# Patient Record
Sex: Female | Born: 1935
Health system: Southern US, Community
[De-identification: ages and names within clinical notes are randomized; demographics above are authoritative.]

## PROBLEM LIST (undated history)

## (undated) DIAGNOSIS — N183 Chronic kidney disease, stage 3 unspecified: Secondary | ICD-10-CM

## (undated) DIAGNOSIS — E78019 Familial hypercholesterolemia, unspecified: Secondary | ICD-10-CM

## (undated) DIAGNOSIS — H409 Unspecified glaucoma: Secondary | ICD-10-CM

## (undated) DIAGNOSIS — E7801 Familial hypercholesterolemia: Secondary | ICD-10-CM

## (undated) DIAGNOSIS — G43909 Migraine, unspecified, not intractable, without status migrainosus: Secondary | ICD-10-CM

## (undated) DIAGNOSIS — E039 Hypothyroidism, unspecified: Secondary | ICD-10-CM

## (undated) DIAGNOSIS — K219 Gastro-esophageal reflux disease without esophagitis: Secondary | ICD-10-CM

## (undated) DIAGNOSIS — B0229 Other postherpetic nervous system involvement: Secondary | ICD-10-CM

## (undated) HISTORY — DX: Familial hypercholesterolemia: E78.01

## (undated) HISTORY — PX: OTHER SURGICAL HISTORY: SHX169

## (undated) HISTORY — PX: BASAL CELL CARCINOMA EXCISION: SHX1214

## (undated) HISTORY — DX: Unspecified glaucoma: H40.9

## (undated) HISTORY — PX: TUBAL LIGATION: SHX77

## (undated) HISTORY — PX: APPENDECTOMY: SHX54

## (undated) HISTORY — PX: INCONTINENCE SURGERY: SHX676

## (undated) HISTORY — PX: ANAL FISSURE REPAIR: SHX2312

## (undated) HISTORY — DX: Hypothyroidism, unspecified: E03.9

## (undated) HISTORY — PX: CHOLECYSTECTOMY: SHX55

## (undated) HISTORY — PX: THYROIDECTOMY: SHX17

## (undated) HISTORY — DX: Other postherpetic nervous system involvement: B02.29

## (undated) HISTORY — DX: Chronic kidney disease, stage 3 unspecified: N18.30

## (undated) HISTORY — PX: VAGINAL HYSTERECTOMY: SUR661

## (undated) HISTORY — DX: Familial hypercholesterolemia, unspecified: E78.019

## (undated) HISTORY — DX: Chronic kidney disease, stage 3 (moderate): N18.3

## (undated) HISTORY — DX: Migraine, unspecified, not intractable, without status migrainosus: G43.909

## (undated) HISTORY — DX: Gastro-esophageal reflux disease without esophagitis: K21.9

---

## 1998-06-16 ENCOUNTER — Other Ambulatory Visit: Admission: RE | Admit: 1998-06-16 | Discharge: 1998-06-16 | Payer: Self-pay | Admitting: Obstetrics and Gynecology

## 1999-06-22 ENCOUNTER — Other Ambulatory Visit: Admission: RE | Admit: 1999-06-22 | Discharge: 1999-06-22 | Payer: Self-pay | Admitting: Obstetrics and Gynecology

## 2000-06-29 ENCOUNTER — Other Ambulatory Visit: Admission: RE | Admit: 2000-06-29 | Discharge: 2000-06-29 | Payer: Self-pay | Admitting: Obstetrics and Gynecology

## 2001-06-19 ENCOUNTER — Other Ambulatory Visit: Admission: RE | Admit: 2001-06-19 | Discharge: 2001-06-19 | Payer: Self-pay | Admitting: Obstetrics and Gynecology

## 2002-06-20 ENCOUNTER — Other Ambulatory Visit: Admission: RE | Admit: 2002-06-20 | Discharge: 2002-06-20 | Payer: Self-pay | Admitting: Obstetrics and Gynecology

## 2002-08-21 ENCOUNTER — Encounter: Payer: Self-pay | Admitting: Urology

## 2002-08-23 ENCOUNTER — Observation Stay (HOSPITAL_COMMUNITY): Admission: RE | Admit: 2002-08-23 | Discharge: 2002-08-24 | Payer: Self-pay | Admitting: Urology

## 2003-02-06 ENCOUNTER — Encounter: Admission: RE | Admit: 2003-02-06 | Discharge: 2003-03-29 | Payer: Self-pay | Admitting: Family Medicine

## 2003-04-03 ENCOUNTER — Ambulatory Visit (HOSPITAL_COMMUNITY): Admission: RE | Admit: 2003-04-03 | Discharge: 2003-04-03 | Payer: Self-pay | Admitting: Gastroenterology

## 2003-12-09 ENCOUNTER — Emergency Department (HOSPITAL_COMMUNITY): Admission: EM | Admit: 2003-12-09 | Discharge: 2003-12-09 | Payer: Self-pay | Admitting: Emergency Medicine

## 2003-12-31 ENCOUNTER — Ambulatory Visit (HOSPITAL_COMMUNITY): Admission: RE | Admit: 2003-12-31 | Discharge: 2004-01-01 | Payer: Self-pay | Admitting: Surgery

## 2003-12-31 ENCOUNTER — Encounter (INDEPENDENT_AMBULATORY_CARE_PROVIDER_SITE_OTHER): Payer: Self-pay | Admitting: *Deleted

## 2004-08-26 ENCOUNTER — Other Ambulatory Visit: Admission: RE | Admit: 2004-08-26 | Discharge: 2004-08-26 | Payer: Self-pay | Admitting: Obstetrics and Gynecology

## 2004-10-06 ENCOUNTER — Encounter: Admission: RE | Admit: 2004-10-06 | Discharge: 2004-10-06 | Payer: Self-pay | Admitting: Allergy and Immunology

## 2005-04-07 ENCOUNTER — Ambulatory Visit: Payer: Self-pay | Admitting: Critical Care Medicine

## 2005-04-30 ENCOUNTER — Ambulatory Visit: Payer: Self-pay | Admitting: Pulmonary Disease

## 2005-05-24 ENCOUNTER — Ambulatory Visit: Payer: Self-pay | Admitting: Critical Care Medicine

## 2005-07-12 ENCOUNTER — Ambulatory Visit: Payer: Self-pay | Admitting: Critical Care Medicine

## 2006-08-22 ENCOUNTER — Encounter: Admission: RE | Admit: 2006-08-22 | Discharge: 2006-09-26 | Payer: Self-pay | Admitting: Family Medicine

## 2008-09-04 ENCOUNTER — Encounter: Admission: RE | Admit: 2008-09-04 | Discharge: 2008-09-04 | Payer: Self-pay | Admitting: Gastroenterology

## 2008-10-03 ENCOUNTER — Encounter: Admission: RE | Admit: 2008-10-03 | Discharge: 2008-11-13 | Payer: Self-pay | Admitting: Family Medicine

## 2011-01-01 NOTE — Op Note (Signed)
Brittney Brittney [name], Brittney [name]                       ACCOUNT NO.:  [id number]   MEDICAL RECORD NO.:  [id number]                   PATIENT TYPE:  OIB   LOCATION:  6120                                 FACILITY:  [facility]   PHYSICIAN:  Sandria Bales. Ezzard Standing, M.D.               DATE OF BIRTH:  02-28-1935   DATE OF PROCEDURE:  12/31/2003  DATE OF DISCHARGE:                                 OPERATIVE REPORT   PREOPERATIVE DIAGNOSIS:  Chronic cholecystitis with cholelithiasis.   POSTOPERATIVE DIAGNOSIS:  Chronic cholecystitis with cholelithiasis.   OPERATION PERFORMED:  Laparoscopic cholecystectomy with intraoperative  cholangiogram.   SURGEON:  Sandria Bales. Ezzard Standing, M.D.   ASSISTANT:  Anselm Pancoast. Zachery Dakins, M.D.   ANESTHESIA:  General endotracheal.   ESTIMATED BLOOD LOSS:  Minimal.   INDICATIONS FOR PROCEDURE:  Brittney Brittney [name] is a 76 year old white female who  presented to [facility] Emergency Department on December 09, 2003 with  abdominal pain and sludge in her gallbladder, mild elevated liver function  tests.  She was felt to have gallbladder disease at that time.  I explained  to her the indications and potential complications of gallbladder surgery,  potential complications including but not limited to bleeding, infection,  bile duct injury and open surgery.  She has since scheduled herself for  attempted laparoscopic cholecystectomy.   DESCRIPTION OF PROCEDURE:  The patient presents to the operating room.  She  has a possible latex allergy and we did precautions with this.  She was  given 1 g Ancef at the initiation of the procedure, had PAS stockings in  place.  Her abdomen was prepped with Betadine solution and sterilely draped.  Infraumbilical incision was made with sharp dissection carried down into the  abdominal cavity.  A 30 degree 10 mm laparoscope was inserted through a 12  mm Hasson.  The Hasson was secured with a 0 Vicryl suture.  A laparoscopic  evaluation was carried out.  The right and  left lobes of the liver were  unremarkable.  The stomach was unremarkable.  The bowel was covered with  omentum which limited exposure to the bowel.  There was nothing obvious in  the pelvis.   Three additional trocars were placed, a 10 mm subxiphoid Ethicon trocar, a 5  mm right subcostal and a 5 mm left subcostal trocar.  The gallbladder was  grasped and rotated cephalad.  There were adhesions of the distal one third  of the gallbladder with the duodenum attached to this.  She did have some  evidence of chronic gallbladder disease.   Dissection was carried out mobilizing the gallbladder.  I identified the  gallbladder cystic duct junction and identified the cystic artery, triply  endoclipped the cystic artery, placed a clip on the gallbladder side of the  cystic duct and shot an intraoperative cholangiogram.  The intraoperative  cholangiogram was shot with half strength Hypaque solution through a cut off  taut catheter.  The taut catheter was inserted through a 14 gauge Jelco into  the side of the cut cystic duct.  When I cut the cystic duct, I did milk out  some debris out of the cystic duct and this would be consistent with cystic  duct obstruction.  I was unable to pass the taut catheter without  difficulty, shot an intraoperative cholangiogram with about 6 mL of half  strength Hypaque solution. This showed free flow of contrast down the cystic  duct into the common bile duct up the hepatic radicals and down into the  duodenum making the normal S-curve of the common bile duct and hepatic duct.  This was felt to be a normal intraoperative cholangiogram.  The taut  catheter was then removed.  The cystic duct was triply endoclipped and then  divided. The gallbladder was then sharply and bluntly dissected up off the  gallbladder bed using primarily hook Bovie coagulation.  Prior to complete  division of the gallbladder from the gallbladder bed, I revisualized the  triangle of Calot.  I  revisualized the gallbladder bed, there was no  bleeding and no bile leak.  The gallbladder was then divided, delivered  through the umbilicus intact and sent to pathology.   I then irrigated the abdomen with about 500 mL of saline.  Each port was  then visualized as the trocars were removed.  The umbilical port closed with  a 0 Vicryl suture.  I infiltrated each port site with 0.25% Marcaine.  The  skin at each site was closed with a 5-0 Vicryl suture, painted with tincture  of benzoin and steri-stripped.  The patient tolerated the procedure well and  was transported to the recovery room in good condition.  Sponge and needle  counts were correct at the end of this case.                                               Sandria Bales. Ezzard Standing, M.D.    DHN/MEDQ  D:  12/31/2003  T:  12/31/2003  Job:  [id number]   cc:   [name]. [name], M.D.  [address]  [place]  [place] [address]  Fax: [phone]   [name] [name], M.D.  [address]  [place]  [place] [address]  Fax: [phone]   Clabe Seal. [name], M.D.  [address]  [place]  [place] [address]  Fax: [phone]

## 2011-01-01 NOTE — Consult Note (Signed)
Brittney [name], TATE NO.:  [id number]   MEDICAL RECORD NO.:  [id number]                   PATIENT TYPE:  EMS   LOCATION:  ED                                   FACILITY:  [facility]   PHYSICIAN:  Sandria Bales. Ezzard Standing, M.D.               DATE OF BIRTH:  1934/11/13   DATE OF CONSULTATION:  12/09/2003  DATE OF DISCHARGE:                                   CONSULTATION   REFERRING PHYSICIAN:  [name]. [name], M.D.   HISTORY OF PRESENT ILLNESS:  This is a 76 year old, white female, patient of  Dr. [name], who had some vague GI complaints, but starting having  symptoms last Tuesday which was April 20, with retrosternal chest pain she  described as aching radiating down her left arm.  This apparently got  better.  She did not bring it to medical attention.  On Friday, April 23,  she had another attack of pain, kind of epigastric radiating to her chest  and down both sides, but did not go down her arm this time.  Then, late last  night and early this morning, she awoke again with kind of vague  retrosternal epigastric abdominal pain which may be a little more right-  sided this time.  She has had no history of peptic ulcer disease, but she  does have significant reflux for which she is on Nexium.  In fact, she even  doubled her dose of Nexium which seemed to help a little bit with her  symptoms.   She has denied any known history of liver disease, pancreatic disease, colon  disease.  She did have a colonoscopy by Dr. [name] in 2003, which was  negative as far as she knows.   PAST SURGICAL HISTORY:  1. Appendectomy as a child in 43.  2. Tubal ligation in 1962.  3. Laparoscopic surgery in 1986, for evaluation of cystitis.  4. Vaginal repair and sling for urinary incontinence by [name]     [name], M.D. last year in 2003.   ALLERGIES:  ALLEGRA which makes her short of breath, AUGMENTIN causes  itching, ELAVIL which makes her see things crooked,  MOTRIN which makes her  tongue swell, SULFA which she cannot remember and LIPITOR (not really sure  why she had trouble, but she describes an allergy or intolerance).   CURRENT MEDICATIONS:  1. Synthroid 0.075 mg daily.  2. Zocor 20 mg daily.  3. Nexium 40 mg daily.  4. Flexeril 5 mg daily.  5. Ecotrin 325 mg daily.  6. Vitamin C 2000 mg daily.  7. Fioricet and Imitrex for migraine headaches on about a once a week basis.  8. Vivelle patch for hormones.   REVIEW OF SYMPTOMS:  NEUROLOGIC:  She has had migraines for years.  They  have actually gotten somewhat better and she is again followed by Dr.  [name] for this.  PULMONARY:  No  history pneumonia or tuberculosis.  CARDIAC:  No history of chest disease, angina or cardiac evaluation.  GASTROINTESTINAL:  See history of present illness.  UROLOGIC:  She says  since her incontinence surgery and her vaginal repair, she has had some kind  of dry odor for which she has taken Ecotrin and Vitamin C and this has  helped a little bit.  She also underwent a CT scan of her abdomen in  November 2004, and as far as she know is okay.  This was by Dr. [name].  GYNECOLOGIC:  She has four children, three girls and a boy and seven  grandchildren.  She has not worked in recent years.   PHYSICAL EXAMINATION:  VITAL SIGNS:  Temperature 97.8, pulse 76,  respirations 20.  HEENT:  Normal pupils.  Mucosa of her nose and mouth are okay.  NECK:  Shows a scar from a thyroid surgery, but no thyroid mass.  LUNGS:  Clear to auscultation.  HEART:  Regular rate and rhythm without murmur or rub.  BREASTS:  I did not exam, but she has had mammograms which have been  reported to her as normal.  ABDOMEN:  Soft.  She has a right lower quadrant scar from her prior  appendectomy.  She has some very vague soreness, but it is actually most in  the left lower quadrant.  She has not guarding, no rebound, no palpable  mass, no evidence of hernia.  EXTREMITIES:  She has  good strength in upper and lower extremities.  NEUROLOGIC:  Grossly intact.   LABORATORY DATA AND X-RAY FINDINGS:  White blood count 7800, hemoglobin  14.3, hematocrit 42.  Sodium 137, potassium 3.5, chloride 105, CO2 29,  glucose 143, BUN 12, creatinine 1.0.  Her SGOT 287, SGPT 145, Alk phos 120,  total bilirubin 1.5.  Urinalysis was negative.  CK-MB 1.3 ng/ml and troponin  0.01 ng/ml.  Her EKG showed a normal sinus rhythm.   She had an ultrasound which I reviewed with Dr. [name].  It does show  sludge in the gallbladder.  There is no thickening of the gallbladder wall,  no common bile duct dilatation and no other mass.   IMPRESSION:  1. Ms. Leyendecker has probable gallbladder disease with vague epigastric     symptoms, mild elevation of liver functions in face of gallbladder     sludge.  I spoke to her about the options for further evaluation versus     surgery.  I am really not sure if any further evaluation will change the     underlying factor of her gallbladder sludge and liver function changes.     I talked about laparoscopic cholecystectomy with the indications and     potential complications including, but not limited to bleeding,     infection, open surgery and common bile duct injury.  I gave her some     Vicodin for pain.  They are going to call our office today to schedule     this surgery electively in the next couple of weeks.  I told them we have     literature in the office to go by and obtain to review this more.  2. Mild liver function and elevation, presumably secondary to gallbladder,     possibly secondary to Zocor.  She had liver function tests done at Dr.     [name] office which we will try to obtain.  3. Computed tomography scan done by Dr. [name].  We will just review  this and make sure there is nothing else identified on that.  4. Reflux esophagitis.  5. Right thigh bursitis which bothers her some. 6. Dry odor on Ecotrin and Vitamin C.  She knows  to stop the Ecotrin at     least seven days ahead of the operation.  7. Migraine headaches followed by Dr. [name].  8. Hypercholesterolemia.  9. Glaucoma.                                               Sandria Bales. Ezzard Standing, M.D.    DHN/MEDQ  D:  12/09/2003  T:  12/09/2003  Job:  [id number]   cc:   [name]. [name], M.D.  [address]  [place]  [place] [address]  Fax: [phone]   [name] [name], M.D.  [address]  [place]  [place] [address]  Fax: [phone]   Clabe Seal. [name], M.D.  [address]  [place]  [place] [address]  Fax: [phone]

## 2011-06-02 ENCOUNTER — Other Ambulatory Visit: Payer: Self-pay | Admitting: Gastroenterology

## 2011-06-02 DIAGNOSIS — R197 Diarrhea, unspecified: Secondary | ICD-10-CM

## 2011-06-15 ENCOUNTER — Ambulatory Visit
Admission: RE | Admit: 2011-06-15 | Discharge: 2011-06-15 | Disposition: A | Discharge status: Home / Self Care | Payer: Medicare Other | Source: Ambulatory Visit | Attending: Gastroenterology | Admitting: Gastroenterology

## 2011-06-15 DIAGNOSIS — R197 Diarrhea, unspecified: Secondary | ICD-10-CM

## 2011-06-15 MED ORDER — IOHEXOL 300 MG/ML  SOLN
100.0000 mL | Freq: Once | INTRAMUSCULAR | Status: AC | PRN
  Administered 2011-06-15: 100 mL via INTRAVENOUS

## 2011-11-22 DIAGNOSIS — Z13820 Encounter for screening for osteoporosis: Secondary | ICD-10-CM | POA: Diagnosis not present

## 2011-11-22 DIAGNOSIS — Z124 Encounter for screening for malignant neoplasm of cervix: Secondary | ICD-10-CM | POA: Diagnosis not present

## 2011-11-22 DIAGNOSIS — M899 Disorder of bone, unspecified: Secondary | ICD-10-CM | POA: Diagnosis not present

## 2011-12-07 DIAGNOSIS — N949 Unspecified condition associated with female genital organs and menstrual cycle: Secondary | ICD-10-CM | POA: Diagnosis not present

## 2011-12-30 DIAGNOSIS — H409 Unspecified glaucoma: Secondary | ICD-10-CM | POA: Diagnosis not present

## 2011-12-30 DIAGNOSIS — H4011X0 Primary open-angle glaucoma, stage unspecified: Secondary | ICD-10-CM | POA: Diagnosis not present

## 2012-02-10 DIAGNOSIS — Z0000 Encounter for general adult medical examination without abnormal findings: Secondary | ICD-10-CM | POA: Diagnosis not present

## 2012-02-10 DIAGNOSIS — R9389 Abnormal findings on diagnostic imaging of other specified body structures: Secondary | ICD-10-CM | POA: Diagnosis not present

## 2012-02-10 DIAGNOSIS — Z23 Encounter for immunization: Secondary | ICD-10-CM | POA: Diagnosis not present

## 2012-02-10 DIAGNOSIS — E559 Vitamin D deficiency, unspecified: Secondary | ICD-10-CM | POA: Diagnosis not present

## 2012-02-10 DIAGNOSIS — Z79899 Other long term (current) drug therapy: Secondary | ICD-10-CM | POA: Diagnosis not present

## 2012-02-10 DIAGNOSIS — R7301 Impaired fasting glucose: Secondary | ICD-10-CM | POA: Diagnosis not present

## 2012-02-10 DIAGNOSIS — E039 Hypothyroidism, unspecified: Secondary | ICD-10-CM | POA: Diagnosis not present

## 2012-02-10 DIAGNOSIS — E7800 Pure hypercholesterolemia, unspecified: Secondary | ICD-10-CM | POA: Diagnosis not present

## 2012-02-24 DIAGNOSIS — I422 Other hypertrophic cardiomyopathy: Secondary | ICD-10-CM | POA: Diagnosis not present

## 2012-04-04 DIAGNOSIS — H25019 Cortical age-related cataract, unspecified eye: Secondary | ICD-10-CM | POA: Insufficient documentation

## 2012-04-04 DIAGNOSIS — H409 Unspecified glaucoma: Secondary | ICD-10-CM | POA: Diagnosis not present

## 2012-04-04 DIAGNOSIS — H2510 Age-related nuclear cataract, unspecified eye: Secondary | ICD-10-CM | POA: Diagnosis not present

## 2012-04-04 DIAGNOSIS — H4010X0 Unspecified open-angle glaucoma, stage unspecified: Secondary | ICD-10-CM | POA: Insufficient documentation

## 2012-04-27 DIAGNOSIS — Z0000 Encounter for general adult medical examination without abnormal findings: Secondary | ICD-10-CM | POA: Diagnosis not present

## 2012-05-05 ENCOUNTER — Ambulatory Visit
Admission: RE | Admit: 2012-05-05 | Discharge: 2012-05-05 | Disposition: A | Discharge status: Home / Self Care | Payer: Medicare Other | Source: Ambulatory Visit | Attending: Family Medicine | Admitting: Family Medicine

## 2012-05-05 ENCOUNTER — Other Ambulatory Visit: Payer: Self-pay | Admitting: Family Medicine

## 2012-05-05 DIAGNOSIS — M7989 Other specified soft tissue disorders: Secondary | ICD-10-CM | POA: Diagnosis not present

## 2012-05-05 DIAGNOSIS — M79609 Pain in unspecified limb: Secondary | ICD-10-CM | POA: Diagnosis not present

## 2012-05-05 DIAGNOSIS — M79669 Pain in unspecified lower leg: Secondary | ICD-10-CM

## 2012-05-17 DIAGNOSIS — Z91040 Latex allergy status: Secondary | ICD-10-CM | POA: Insufficient documentation

## 2012-05-17 DIAGNOSIS — E041 Nontoxic single thyroid nodule: Secondary | ICD-10-CM | POA: Insufficient documentation

## 2012-05-17 DIAGNOSIS — E785 Hyperlipidemia, unspecified: Secondary | ICD-10-CM | POA: Insufficient documentation

## 2012-05-17 DIAGNOSIS — Z8619 Personal history of other infectious and parasitic diseases: Secondary | ICD-10-CM | POA: Insufficient documentation

## 2012-05-29 DIAGNOSIS — K219 Gastro-esophageal reflux disease without esophagitis: Secondary | ICD-10-CM | POA: Diagnosis not present

## 2012-05-29 DIAGNOSIS — H2510 Age-related nuclear cataract, unspecified eye: Secondary | ICD-10-CM | POA: Diagnosis not present

## 2012-05-29 DIAGNOSIS — E785 Hyperlipidemia, unspecified: Secondary | ICD-10-CM | POA: Diagnosis not present

## 2012-05-29 DIAGNOSIS — Z91040 Latex allergy status: Secondary | ICD-10-CM | POA: Diagnosis not present

## 2012-05-29 DIAGNOSIS — I10 Essential (primary) hypertension: Secondary | ICD-10-CM | POA: Diagnosis not present

## 2012-05-29 DIAGNOSIS — K589 Irritable bowel syndrome without diarrhea: Secondary | ICD-10-CM | POA: Diagnosis not present

## 2012-05-29 DIAGNOSIS — I2510 Atherosclerotic heart disease of native coronary artery without angina pectoris: Secondary | ICD-10-CM | POA: Diagnosis not present

## 2012-05-29 DIAGNOSIS — Z961 Presence of intraocular lens: Secondary | ICD-10-CM | POA: Insufficient documentation

## 2012-05-29 DIAGNOSIS — IMO0001 Reserved for inherently not codable concepts without codable children: Secondary | ICD-10-CM | POA: Diagnosis not present

## 2012-05-29 DIAGNOSIS — Z79899 Other long term (current) drug therapy: Secondary | ICD-10-CM | POA: Diagnosis not present

## 2012-06-06 DIAGNOSIS — Z23 Encounter for immunization: Secondary | ICD-10-CM | POA: Diagnosis not present

## 2012-06-06 DIAGNOSIS — Z9849 Cataract extraction status, unspecified eye: Secondary | ICD-10-CM | POA: Diagnosis not present

## 2012-06-06 DIAGNOSIS — Z961 Presence of intraocular lens: Secondary | ICD-10-CM | POA: Diagnosis not present

## 2012-06-27 DIAGNOSIS — Z9849 Cataract extraction status, unspecified eye: Secondary | ICD-10-CM | POA: Diagnosis not present

## 2012-06-27 DIAGNOSIS — Z961 Presence of intraocular lens: Secondary | ICD-10-CM | POA: Diagnosis not present

## 2012-07-27 DIAGNOSIS — E039 Hypothyroidism, unspecified: Secondary | ICD-10-CM | POA: Diagnosis not present

## 2012-07-27 DIAGNOSIS — R42 Dizziness and giddiness: Secondary | ICD-10-CM | POA: Diagnosis not present

## 2012-07-27 DIAGNOSIS — H4011X0 Primary open-angle glaucoma, stage unspecified: Secondary | ICD-10-CM | POA: Diagnosis not present

## 2012-07-27 DIAGNOSIS — H6590 Unspecified nonsuppurative otitis media, unspecified ear: Secondary | ICD-10-CM | POA: Diagnosis not present

## 2012-07-27 DIAGNOSIS — H409 Unspecified glaucoma: Secondary | ICD-10-CM | POA: Diagnosis not present

## 2012-07-28 ENCOUNTER — Other Ambulatory Visit: Payer: Self-pay | Admitting: Family Medicine

## 2012-07-28 DIAGNOSIS — R42 Dizziness and giddiness: Secondary | ICD-10-CM

## 2012-08-01 ENCOUNTER — Ambulatory Visit
Admission: RE | Admit: 2012-08-01 | Discharge: 2012-08-01 | Disposition: A | Discharge status: Home / Self Care | Payer: Medicare Other | Source: Ambulatory Visit | Attending: Family Medicine | Admitting: Family Medicine

## 2012-08-01 DIAGNOSIS — I658 Occlusion and stenosis of other precerebral arteries: Secondary | ICD-10-CM | POA: Diagnosis not present

## 2012-08-01 DIAGNOSIS — R42 Dizziness and giddiness: Secondary | ICD-10-CM | POA: Diagnosis not present

## 2012-08-01 DIAGNOSIS — R51 Headache: Secondary | ICD-10-CM | POA: Diagnosis not present

## 2012-08-14 ENCOUNTER — Other Ambulatory Visit: Payer: Self-pay | Admitting: Dermatology

## 2012-08-14 DIAGNOSIS — L821 Other seborrheic keratosis: Secondary | ICD-10-CM | POA: Diagnosis not present

## 2012-08-14 DIAGNOSIS — D485 Neoplasm of uncertain behavior of skin: Secondary | ICD-10-CM | POA: Diagnosis not present

## 2012-08-14 DIAGNOSIS — L570 Actinic keratosis: Secondary | ICD-10-CM | POA: Diagnosis not present

## 2012-08-14 DIAGNOSIS — Z85828 Personal history of other malignant neoplasm of skin: Secondary | ICD-10-CM | POA: Diagnosis not present

## 2012-08-14 DIAGNOSIS — L820 Inflamed seborrheic keratosis: Secondary | ICD-10-CM | POA: Diagnosis not present

## 2012-08-15 DIAGNOSIS — Z1231 Encounter for screening mammogram for malignant neoplasm of breast: Secondary | ICD-10-CM | POA: Diagnosis not present

## 2012-09-21 DIAGNOSIS — R350 Frequency of micturition: Secondary | ICD-10-CM | POA: Diagnosis not present

## 2012-09-21 DIAGNOSIS — N289 Disorder of kidney and ureter, unspecified: Secondary | ICD-10-CM | POA: Diagnosis not present

## 2012-09-21 DIAGNOSIS — E7800 Pure hypercholesterolemia, unspecified: Secondary | ICD-10-CM | POA: Diagnosis not present

## 2012-10-02 DIAGNOSIS — R109 Unspecified abdominal pain: Secondary | ICD-10-CM | POA: Diagnosis not present

## 2012-10-02 DIAGNOSIS — N3942 Incontinence without sensory awareness: Secondary | ICD-10-CM | POA: Diagnosis not present

## 2012-10-02 DIAGNOSIS — R350 Frequency of micturition: Secondary | ICD-10-CM | POA: Diagnosis not present

## 2012-10-04 DIAGNOSIS — K589 Irritable bowel syndrome without diarrhea: Secondary | ICD-10-CM | POA: Diagnosis not present

## 2012-10-04 DIAGNOSIS — R350 Frequency of micturition: Secondary | ICD-10-CM | POA: Diagnosis not present

## 2012-10-04 DIAGNOSIS — K219 Gastro-esophageal reflux disease without esophagitis: Secondary | ICD-10-CM | POA: Diagnosis not present

## 2012-10-04 DIAGNOSIS — R1084 Generalized abdominal pain: Secondary | ICD-10-CM | POA: Diagnosis not present

## 2012-10-10 DIAGNOSIS — Z85828 Personal history of other malignant neoplasm of skin: Secondary | ICD-10-CM | POA: Diagnosis not present

## 2012-10-10 DIAGNOSIS — R109 Unspecified abdominal pain: Secondary | ICD-10-CM | POA: Diagnosis not present

## 2012-10-10 DIAGNOSIS — R1032 Left lower quadrant pain: Secondary | ICD-10-CM | POA: Diagnosis not present

## 2012-10-26 DIAGNOSIS — H409 Unspecified glaucoma: Secondary | ICD-10-CM | POA: Diagnosis not present

## 2012-10-26 DIAGNOSIS — H4011X0 Primary open-angle glaucoma, stage unspecified: Secondary | ICD-10-CM | POA: Diagnosis not present

## 2012-10-27 DIAGNOSIS — N3942 Incontinence without sensory awareness: Secondary | ICD-10-CM | POA: Diagnosis not present

## 2012-10-27 DIAGNOSIS — R350 Frequency of micturition: Secondary | ICD-10-CM | POA: Diagnosis not present

## 2012-11-03 DIAGNOSIS — N3942 Incontinence without sensory awareness: Secondary | ICD-10-CM | POA: Diagnosis not present

## 2012-11-03 DIAGNOSIS — R350 Frequency of micturition: Secondary | ICD-10-CM | POA: Diagnosis not present

## 2012-12-05 DIAGNOSIS — R1084 Generalized abdominal pain: Secondary | ICD-10-CM | POA: Diagnosis not present

## 2012-12-05 DIAGNOSIS — Z1211 Encounter for screening for malignant neoplasm of colon: Secondary | ICD-10-CM | POA: Diagnosis not present

## 2012-12-05 DIAGNOSIS — K5900 Constipation, unspecified: Secondary | ICD-10-CM | POA: Diagnosis not present

## 2012-12-07 DIAGNOSIS — Z124 Encounter for screening for malignant neoplasm of cervix: Secondary | ICD-10-CM | POA: Diagnosis not present

## 2012-12-11 DIAGNOSIS — R350 Frequency of micturition: Secondary | ICD-10-CM | POA: Diagnosis not present

## 2012-12-11 DIAGNOSIS — N3942 Incontinence without sensory awareness: Secondary | ICD-10-CM | POA: Diagnosis not present

## 2013-02-20 DIAGNOSIS — R7301 Impaired fasting glucose: Secondary | ICD-10-CM | POA: Diagnosis not present

## 2013-02-20 DIAGNOSIS — E7800 Pure hypercholesterolemia, unspecified: Secondary | ICD-10-CM | POA: Diagnosis not present

## 2013-02-20 DIAGNOSIS — Z1331 Encounter for screening for depression: Secondary | ICD-10-CM | POA: Diagnosis not present

## 2013-02-20 DIAGNOSIS — Z0000 Encounter for general adult medical examination without abnormal findings: Secondary | ICD-10-CM | POA: Diagnosis not present

## 2013-02-20 DIAGNOSIS — Z23 Encounter for immunization: Secondary | ICD-10-CM | POA: Diagnosis not present

## 2013-02-20 DIAGNOSIS — E039 Hypothyroidism, unspecified: Secondary | ICD-10-CM | POA: Diagnosis not present

## 2013-02-20 DIAGNOSIS — R5381 Other malaise: Secondary | ICD-10-CM | POA: Diagnosis not present

## 2013-02-20 DIAGNOSIS — R5383 Other fatigue: Secondary | ICD-10-CM | POA: Diagnosis not present

## 2013-02-20 DIAGNOSIS — R011 Cardiac murmur, unspecified: Secondary | ICD-10-CM | POA: Diagnosis not present

## 2013-04-12 ENCOUNTER — Other Ambulatory Visit: Payer: Self-pay | Admitting: Gastroenterology

## 2013-04-12 DIAGNOSIS — K648 Other hemorrhoids: Secondary | ICD-10-CM | POA: Diagnosis not present

## 2013-04-12 DIAGNOSIS — Z1211 Encounter for screening for malignant neoplasm of colon: Secondary | ICD-10-CM | POA: Diagnosis not present

## 2013-04-12 DIAGNOSIS — K5730 Diverticulosis of large intestine without perforation or abscess without bleeding: Secondary | ICD-10-CM | POA: Diagnosis not present

## 2013-04-12 DIAGNOSIS — D126 Benign neoplasm of colon, unspecified: Secondary | ICD-10-CM | POA: Diagnosis not present

## 2013-04-18 DIAGNOSIS — R351 Nocturia: Secondary | ICD-10-CM | POA: Diagnosis not present

## 2013-04-18 DIAGNOSIS — R350 Frequency of micturition: Secondary | ICD-10-CM | POA: Diagnosis not present

## 2013-04-18 DIAGNOSIS — N3942 Incontinence without sensory awareness: Secondary | ICD-10-CM | POA: Diagnosis not present

## 2013-04-20 DIAGNOSIS — H4010X0 Unspecified open-angle glaucoma, stage unspecified: Secondary | ICD-10-CM | POA: Diagnosis not present

## 2013-04-20 DIAGNOSIS — H409 Unspecified glaucoma: Secondary | ICD-10-CM | POA: Diagnosis not present

## 2013-05-30 DIAGNOSIS — Z23 Encounter for immunization: Secondary | ICD-10-CM | POA: Diagnosis not present

## 2013-06-13 DIAGNOSIS — R1084 Generalized abdominal pain: Secondary | ICD-10-CM | POA: Diagnosis not present

## 2013-08-21 DIAGNOSIS — E7800 Pure hypercholesterolemia, unspecified: Secondary | ICD-10-CM | POA: Diagnosis not present

## 2013-08-21 DIAGNOSIS — K219 Gastro-esophageal reflux disease without esophagitis: Secondary | ICD-10-CM | POA: Diagnosis not present

## 2013-08-21 DIAGNOSIS — J329 Chronic sinusitis, unspecified: Secondary | ICD-10-CM | POA: Diagnosis not present

## 2013-08-21 DIAGNOSIS — N183 Chronic kidney disease, stage 3 unspecified: Secondary | ICD-10-CM | POA: Diagnosis not present

## 2013-08-21 DIAGNOSIS — R7301 Impaired fasting glucose: Secondary | ICD-10-CM | POA: Diagnosis not present

## 2013-08-21 DIAGNOSIS — E039 Hypothyroidism, unspecified: Secondary | ICD-10-CM | POA: Diagnosis not present

## 2013-08-22 DIAGNOSIS — Z1231 Encounter for screening mammogram for malignant neoplasm of breast: Secondary | ICD-10-CM | POA: Diagnosis not present

## 2013-09-20 DIAGNOSIS — R059 Cough, unspecified: Secondary | ICD-10-CM | POA: Diagnosis not present

## 2013-09-20 DIAGNOSIS — J329 Chronic sinusitis, unspecified: Secondary | ICD-10-CM | POA: Diagnosis not present

## 2013-09-20 DIAGNOSIS — R05 Cough: Secondary | ICD-10-CM | POA: Diagnosis not present

## 2013-10-03 DIAGNOSIS — R05 Cough: Secondary | ICD-10-CM | POA: Diagnosis not present

## 2013-10-03 DIAGNOSIS — R059 Cough, unspecified: Secondary | ICD-10-CM | POA: Diagnosis not present

## 2013-10-03 DIAGNOSIS — J329 Chronic sinusitis, unspecified: Secondary | ICD-10-CM | POA: Diagnosis not present

## 2013-11-08 DIAGNOSIS — H409 Unspecified glaucoma: Secondary | ICD-10-CM | POA: Diagnosis not present

## 2013-11-08 DIAGNOSIS — H4010X0 Unspecified open-angle glaucoma, stage unspecified: Secondary | ICD-10-CM | POA: Diagnosis not present

## 2013-11-14 DIAGNOSIS — N183 Chronic kidney disease, stage 3 unspecified: Secondary | ICD-10-CM | POA: Diagnosis not present

## 2013-11-14 DIAGNOSIS — E039 Hypothyroidism, unspecified: Secondary | ICD-10-CM | POA: Diagnosis not present

## 2013-11-14 DIAGNOSIS — R0602 Shortness of breath: Secondary | ICD-10-CM | POA: Diagnosis not present

## 2013-11-14 DIAGNOSIS — Q249 Congenital malformation of heart, unspecified: Secondary | ICD-10-CM | POA: Diagnosis not present

## 2013-11-14 DIAGNOSIS — R7301 Impaired fasting glucose: Secondary | ICD-10-CM | POA: Diagnosis not present

## 2013-11-14 DIAGNOSIS — E7800 Pure hypercholesterolemia, unspecified: Secondary | ICD-10-CM | POA: Diagnosis not present

## 2013-11-14 DIAGNOSIS — R5383 Other fatigue: Secondary | ICD-10-CM | POA: Diagnosis not present

## 2013-11-14 DIAGNOSIS — R5381 Other malaise: Secondary | ICD-10-CM | POA: Diagnosis not present

## 2013-11-14 DIAGNOSIS — R011 Cardiac murmur, unspecified: Secondary | ICD-10-CM | POA: Diagnosis not present

## 2013-11-29 DIAGNOSIS — H903 Sensorineural hearing loss, bilateral: Secondary | ICD-10-CM | POA: Diagnosis not present

## 2013-12-06 ENCOUNTER — Other Ambulatory Visit: Payer: Self-pay | Admitting: Dermatology

## 2013-12-06 DIAGNOSIS — L820 Inflamed seborrheic keratosis: Secondary | ICD-10-CM | POA: Diagnosis not present

## 2013-12-06 DIAGNOSIS — D485 Neoplasm of uncertain behavior of skin: Secondary | ICD-10-CM | POA: Diagnosis not present

## 2013-12-06 DIAGNOSIS — Z85828 Personal history of other malignant neoplasm of skin: Secondary | ICD-10-CM | POA: Diagnosis not present

## 2013-12-25 DIAGNOSIS — Z01419 Encounter for gynecological examination (general) (routine) without abnormal findings: Secondary | ICD-10-CM | POA: Diagnosis not present

## 2014-01-03 ENCOUNTER — Encounter: Payer: Self-pay | Admitting: Cardiology

## 2014-01-03 ENCOUNTER — Ambulatory Visit (INDEPENDENT_AMBULATORY_CARE_PROVIDER_SITE_OTHER): Payer: Medicare Other | Admitting: Cardiology

## 2014-01-03 VITALS — BP 146/60 | HR 76 | Ht 61.25 in | Wt 153.0 lb

## 2014-01-03 DIAGNOSIS — R011 Cardiac murmur, unspecified: Secondary | ICD-10-CM | POA: Insufficient documentation

## 2014-01-03 DIAGNOSIS — R0989 Other specified symptoms and signs involving the circulatory and respiratory systems: Secondary | ICD-10-CM

## 2014-01-03 DIAGNOSIS — E7800 Pure hypercholesterolemia, unspecified: Secondary | ICD-10-CM | POA: Diagnosis not present

## 2014-01-03 DIAGNOSIS — R0609 Other forms of dyspnea: Secondary | ICD-10-CM | POA: Diagnosis not present

## 2014-01-03 DIAGNOSIS — E7801 Familial hypercholesterolemia: Secondary | ICD-10-CM

## 2014-01-03 DIAGNOSIS — E78019 Familial hypercholesterolemia, unspecified: Secondary | ICD-10-CM

## 2014-01-03 DIAGNOSIS — R0600 Dyspnea, unspecified: Secondary | ICD-10-CM

## 2014-01-03 NOTE — Progress Notes (Signed)
Brittney [name] Date of Birth: 05-01-1935 Medical Record #[id number]  History of Present Illness: Mrs. Bellemare is seen at the request of Dr. Drema Dallas for cardiac evaluation. She is a pleasant 79 yo WF with history of familial hypercholesterolemia. She is intolerant of statins. She had some valve abnormality and was told that she needed an Echo every 2 years. In Jan she noted some symptoms of getting out of breath when she went up stairs. At that time she states she was having a lot of sinus congestion and drainage. Since then her congestion has resolved and her breathing has returned to normal. She denies any chest pain. She has a history of murmur. She also reports her eye doctor wants to put her on Timolol drops for glaucoma but she wanted to make sure her heart was ok for this. She denies any history of DM, HTN, or tobacco use.   No current outpatient prescriptions on file prior to visit.   No current facility-administered medications on file prior to visit.    Allergies  Allergen Reactions  . Amitriptyline   . Augmentin [Amoxicillin-Pot Clavulanate]   . Latex   . Lipitor [Atorvastatin]   . Motrin [Ibuprofen]   . Sulfa Antibiotics   . Vioxx [Rofecoxib]     Past Medical History  Diagnosis Date  . Familial hypercholesterolemia   . Hypothyroid   . Glaucoma   . GERD (gastroesophageal reflux disease)   . Post herpetic neuralgia   . Migraine headache   . CKD (chronic kidney disease), stage III     Past Surgical History  Procedure Laterality Date  . Appendectomy    . Tubal ligation    . Cholecystectomy    . Vaginal hysterectomy    . Anal fissure repair    . Hemithyroidectomy    . Incontinence surgery    . Vaginal vault repair    . Cataract surgery      left  . Basal cell carcinoma excision      History  Smoking status  . Never Smoker   Smokeless tobacco  . Not on file    History  Alcohol Use: Not on file    Family History  Problem Relation Age of Onset  .  Heart disease Mother     chf  . Heart disease Sister     CABG    Review of Systems: As noted in HPI.  All other systems were reviewed and are negative.  Physical Exam: BP 146/60  Pulse 76  Ht 5' 1.25" (1.556 m)  Wt 153 lb (69.4 kg)  BMI 28.66 kg/m2 She is a pleasant WF in NAD.  HEENT: [place]/AT. PERRL, EOMI, oropharynx is clear. Neck: no JVD, bruits, adenopathy, or thyromegaly. Lungs: clear CV: RRR, normal S1-2. There is a very soft, 1/6 early SEM LUSB. No diastolic murmur or gallop. Abd: soft NT. BS +. No HSM Ext: no cyanosis or edema. Pulses 2+ Skin: warm and dry Neuro: alert and oriented x 3. CN II-XII intact. Nonfocal.  LABORATORY DATA: Ecg: NSR, rate 76 bpm, normal.  Echo reviewed from July 2013. Normal LV function. Mild ASH. EF 60-65%. Mildly calcified AV.  Labs 11/14/13: Normal CBC. Creatinine 1.07. Other chemistries normal. Cholesterol-265, triglycerides-116, HDL-68, LDL-197. TSH is normal.  Assessment / Plan: 1. Heart murmur. Very soft systolic ejection murmur. Echo from July 2013 was really unremarkable. Mild ASH. Mildly calcified AV. I see no reason why she needs follow up Echo every 2 years. Echo 2011 showed same findings.  I have reassured her.  2. Dyspnea. ? Related to sinus congestion. Symptoms resolved. Exam Ok. Ecg is normal. No further work up at this time. Explained that is she did note increased dyspnea or chest pain with exertion I would recommend a stress test.  3. Hypercholesterolemia. Probably familial. Intolerant to statins. I would consider a trial of Zetia 10 mg daily. She may be a candidate for a PCSK 9 inhibitor when cleared by FDA.   I will see her back as needed.

## 2014-01-03 NOTE — Patient Instructions (Signed)
Continue your current therapy.  Consider taking Zetia for your cholesterol. There may be other options in the near future.  If you notice worsening shortness of breath or chest pain let me know.

## 2014-01-04 ENCOUNTER — Encounter: Payer: Self-pay | Admitting: Cardiology

## 2014-01-10 DIAGNOSIS — L821 Other seborrheic keratosis: Secondary | ICD-10-CM | POA: Diagnosis not present

## 2014-01-10 DIAGNOSIS — Z85828 Personal history of other malignant neoplasm of skin: Secondary | ICD-10-CM | POA: Diagnosis not present

## 2014-01-10 DIAGNOSIS — L820 Inflamed seborrheic keratosis: Secondary | ICD-10-CM | POA: Diagnosis not present

## 2014-01-16 ENCOUNTER — Telehealth: Payer: Self-pay | Admitting: Cardiology

## 2014-01-16 NOTE — Telephone Encounter (Signed)
New Message:  Brittney [name] is calling to speak with Malachy Mood to clarify the patient's medications.

## 2014-01-17 DIAGNOSIS — H409 Unspecified glaucoma: Secondary | ICD-10-CM | POA: Diagnosis not present

## 2014-01-17 DIAGNOSIS — H4011X0 Primary open-angle glaucoma, stage unspecified: Secondary | ICD-10-CM | POA: Diagnosis not present

## 2014-01-17 NOTE — Telephone Encounter (Signed)
Returned call to [facility] at [place].Dr.Jordan advised have PCP start Zetia 10 mg daily.Note faxed to [facility] at fax # [phone].

## 2014-02-27 ENCOUNTER — Encounter: Payer: Self-pay | Admitting: *Deleted

## 2014-02-27 DIAGNOSIS — Z23 Encounter for immunization: Secondary | ICD-10-CM | POA: Diagnosis not present

## 2014-02-27 DIAGNOSIS — N183 Chronic kidney disease, stage 3 unspecified: Secondary | ICD-10-CM | POA: Diagnosis not present

## 2014-02-27 DIAGNOSIS — E039 Hypothyroidism, unspecified: Secondary | ICD-10-CM | POA: Diagnosis not present

## 2014-02-27 DIAGNOSIS — R7301 Impaired fasting glucose: Secondary | ICD-10-CM | POA: Diagnosis not present

## 2014-02-27 DIAGNOSIS — Z0000 Encounter for general adult medical examination without abnormal findings: Secondary | ICD-10-CM | POA: Diagnosis not present

## 2014-02-27 DIAGNOSIS — E782 Mixed hyperlipidemia: Secondary | ICD-10-CM | POA: Diagnosis not present

## 2014-02-27 DIAGNOSIS — R109 Unspecified abdominal pain: Secondary | ICD-10-CM | POA: Diagnosis not present

## 2014-03-02 DIAGNOSIS — IMO0002 Reserved for concepts with insufficient information to code with codable children: Secondary | ICD-10-CM | POA: Diagnosis not present

## 2014-03-28 ENCOUNTER — Other Ambulatory Visit: Payer: Self-pay | Admitting: Gastroenterology

## 2014-03-28 DIAGNOSIS — R197 Diarrhea, unspecified: Secondary | ICD-10-CM | POA: Diagnosis not present

## 2014-03-28 DIAGNOSIS — R1084 Generalized abdominal pain: Secondary | ICD-10-CM | POA: Diagnosis not present

## 2014-03-28 DIAGNOSIS — K219 Gastro-esophageal reflux disease without esophagitis: Secondary | ICD-10-CM | POA: Diagnosis not present

## 2014-03-28 DIAGNOSIS — K648 Other hemorrhoids: Secondary | ICD-10-CM | POA: Diagnosis not present

## 2014-04-01 ENCOUNTER — Ambulatory Visit
Admission: RE | Admit: 2014-04-01 | Discharge: 2014-04-01 | Disposition: A | Discharge status: Home / Self Care | Payer: Medicare Other | Source: Ambulatory Visit | Attending: Gastroenterology | Admitting: Gastroenterology

## 2014-04-01 DIAGNOSIS — K862 Cyst of pancreas: Secondary | ICD-10-CM | POA: Diagnosis not present

## 2014-04-01 DIAGNOSIS — K863 Pseudocyst of pancreas: Secondary | ICD-10-CM | POA: Diagnosis not present

## 2014-04-01 DIAGNOSIS — R1084 Generalized abdominal pain: Secondary | ICD-10-CM

## 2014-04-01 MED ORDER — IOHEXOL 300 MG/ML  SOLN
100.0000 mL | Freq: Once | INTRAMUSCULAR | Status: AC | PRN
  Administered 2014-04-01: 100 mL via INTRAVENOUS

## 2014-05-16 DIAGNOSIS — Z23 Encounter for immunization: Secondary | ICD-10-CM | POA: Diagnosis not present

## 2014-05-25 ENCOUNTER — Emergency Department (HOSPITAL_BASED_OUTPATIENT_CLINIC_OR_DEPARTMENT_OTHER): Payer: Medicare Other

## 2014-05-25 ENCOUNTER — Encounter (HOSPITAL_BASED_OUTPATIENT_CLINIC_OR_DEPARTMENT_OTHER): Payer: Self-pay | Admitting: Emergency Medicine

## 2014-05-25 ENCOUNTER — Inpatient Hospital Stay (HOSPITAL_BASED_OUTPATIENT_CLINIC_OR_DEPARTMENT_OTHER)
Admission: EM | Admit: 2014-05-25 | Discharge: 2014-05-27 | DRG: 392 | Disposition: A | Discharge status: Home / Self Care | Payer: Medicare Other | Attending: Internal Medicine | Admitting: Internal Medicine

## 2014-05-25 DIAGNOSIS — E780 Pure hypercholesterolemia: Secondary | ICD-10-CM | POA: Diagnosis present

## 2014-05-25 DIAGNOSIS — Z79899 Other long term (current) drug therapy: Secondary | ICD-10-CM | POA: Diagnosis not present

## 2014-05-25 DIAGNOSIS — K862 Cyst of pancreas: Secondary | ICD-10-CM | POA: Diagnosis present

## 2014-05-25 DIAGNOSIS — E7801 Familial hypercholesterolemia: Secondary | ICD-10-CM | POA: Diagnosis present

## 2014-05-25 DIAGNOSIS — N183 Chronic kidney disease, stage 3 unspecified: Secondary | ICD-10-CM

## 2014-05-25 DIAGNOSIS — H409 Unspecified glaucoma: Secondary | ICD-10-CM | POA: Diagnosis present

## 2014-05-25 DIAGNOSIS — K219 Gastro-esophageal reflux disease without esophagitis: Secondary | ICD-10-CM | POA: Diagnosis present

## 2014-05-25 DIAGNOSIS — K2100 Gastro-esophageal reflux disease with esophagitis, without bleeding: Secondary | ICD-10-CM

## 2014-05-25 DIAGNOSIS — R1030 Lower abdominal pain, unspecified: Secondary | ICD-10-CM | POA: Diagnosis not present

## 2014-05-25 DIAGNOSIS — E039 Hypothyroidism, unspecified: Secondary | ICD-10-CM | POA: Diagnosis present

## 2014-05-25 DIAGNOSIS — K5732 Diverticulitis of large intestine without perforation or abscess without bleeding: Principal | ICD-10-CM | POA: Diagnosis present

## 2014-05-25 DIAGNOSIS — G43909 Migraine, unspecified, not intractable, without status migrainosus: Secondary | ICD-10-CM | POA: Diagnosis present

## 2014-05-25 DIAGNOSIS — E038 Other specified hypothyroidism: Secondary | ICD-10-CM | POA: Diagnosis not present

## 2014-05-25 DIAGNOSIS — I129 Hypertensive chronic kidney disease with stage 1 through stage 4 chronic kidney disease, or unspecified chronic kidney disease: Secondary | ICD-10-CM | POA: Diagnosis present

## 2014-05-25 DIAGNOSIS — R1084 Generalized abdominal pain: Secondary | ICD-10-CM | POA: Diagnosis not present

## 2014-05-25 DIAGNOSIS — K5792 Diverticulitis of intestine, part unspecified, without perforation or abscess without bleeding: Secondary | ICD-10-CM | POA: Diagnosis present

## 2014-05-25 LAB — URINALYSIS, ROUTINE W REFLEX MICROSCOPIC
Bilirubin Urine: NEGATIVE
GLUCOSE, UA: NEGATIVE mg/dL
Hgb urine dipstick: NEGATIVE
Ketones, ur: NEGATIVE mg/dL
Leukocytes, UA: NEGATIVE
NITRITE: NEGATIVE
Protein, ur: NEGATIVE mg/dL
Specific Gravity, Urine: 1.018 (ref 1.005–1.030)
Urobilinogen, UA: 0.2 mg/dL (ref 0.0–1.0)
pH: 5.5 (ref 5.0–8.0)

## 2014-05-25 LAB — CBC WITH DIFFERENTIAL/PLATELET
BASOS ABS: 0.1 10*3/uL (ref 0.0–0.1)
BASOS PCT: 1 % (ref 0–1)
EOS ABS: 0.1 10*3/uL (ref 0.0–0.7)
EOS PCT: 1 % (ref 0–5)
HEMATOCRIT: 42.3 % (ref 36.0–46.0)
HEMOGLOBIN: 14.4 g/dL (ref 12.0–15.0)
Lymphocytes Relative: 23 % (ref 12–46)
Lymphs Abs: 1.9 10*3/uL (ref 0.7–4.0)
MCH: 27.7 pg (ref 26.0–34.0)
MCHC: 34 g/dL (ref 30.0–36.0)
MCV: 81.3 fL (ref 78.0–100.0)
MONO ABS: 0.6 10*3/uL (ref 0.1–1.0)
MONOS PCT: 7 % (ref 3–12)
Neutro Abs: 5.6 10*3/uL (ref 1.7–7.7)
Neutrophils Relative %: 68 % (ref 43–77)
Platelets: 162 10*3/uL (ref 150–400)
RBC: 5.2 MIL/uL — ABNORMAL HIGH (ref 3.87–5.11)
RDW: 13.6 % (ref 11.5–15.5)
WBC: 8.2 10*3/uL (ref 4.0–10.5)

## 2014-05-25 LAB — LIPASE, BLOOD: Lipase: 31 U/L (ref 11–59)

## 2014-05-25 LAB — COMPREHENSIVE METABOLIC PANEL
ALK PHOS: 134 U/L — AB (ref 39–117)
ALT: 13 U/L (ref 0–35)
AST: 17 U/L (ref 0–37)
Albumin: 4.2 g/dL (ref 3.5–5.2)
Anion gap: 14 (ref 5–15)
BUN: 16 mg/dL (ref 6–23)
CO2: 26 meq/L (ref 19–32)
Calcium: 9.7 mg/dL (ref 8.4–10.5)
Chloride: 101 mEq/L (ref 96–112)
Creatinine, Ser: 1 mg/dL (ref 0.50–1.10)
GFR calc Af Amer: 61 mL/min — ABNORMAL LOW (ref >90)
GFR, EST NON AFRICAN AMERICAN: 53 mL/min — AB (ref >90)
GLUCOSE: 98 mg/dL (ref 70–99)
POTASSIUM: 3.8 meq/L (ref 3.7–5.3)
SODIUM: 141 meq/L (ref 137–147)
TOTAL PROTEIN: 7.6 g/dL (ref 6.0–8.3)
Total Bilirubin: 0.4 mg/dL (ref 0.3–1.2)

## 2014-05-25 MED ORDER — ONDANSETRON HCL 4 MG/2ML IJ SOLN
4.0000 mg | Freq: Once | INTRAMUSCULAR | Status: AC
  Administered 2014-05-25: 4 mg via INTRAVENOUS
  Filled 2014-05-25: qty 2

## 2014-05-25 MED ORDER — IOHEXOL 300 MG/ML  SOLN
100.0000 mL | Freq: Once | INTRAMUSCULAR | Status: AC | PRN
  Administered 2014-05-25: 100 mL via INTRAVENOUS

## 2014-05-25 MED ORDER — CIPROFLOXACIN HCL 500 MG PO TABS: 500.0000 mg | ORAL_TABLET | Freq: Two times a day (BID) | ORAL | Status: DC

## 2014-05-25 MED ORDER — CIPROFLOXACIN IN D5W 400 MG/200ML IV SOLN
400.0000 mg | Freq: Once | INTRAVENOUS | Status: AC
  Administered 2014-05-25: 400 mg via INTRAVENOUS
  Filled 2014-05-25: qty 200

## 2014-05-25 MED ORDER — PROMETHAZINE HCL 25 MG/ML IJ SOLN
12.5000 mg | Freq: Once | INTRAMUSCULAR | Status: AC
  Administered 2014-05-25: 12.5 mg via INTRAVENOUS
  Filled 2014-05-25: qty 1

## 2014-05-25 MED ORDER — HYDROMORPHONE HCL 1 MG/ML IJ SOLN
1.0000 mg | Freq: Once | INTRAMUSCULAR | Status: AC
  Administered 2014-05-25: 1 mg via INTRAVENOUS
  Filled 2014-05-25: qty 1

## 2014-05-25 MED ORDER — METRONIDAZOLE IN NACL 5-0.79 MG/ML-% IV SOLN
500.0000 mg | Freq: Once | INTRAVENOUS | Status: AC
  Administered 2014-05-25: 500 mg via INTRAVENOUS
  Filled 2014-05-25: qty 100

## 2014-05-25 MED ORDER — IOHEXOL 300 MG/ML  SOLN
50.0000 mL | Freq: Once | INTRAMUSCULAR | Status: AC | PRN
Start: 2014-05-25 — End: 2014-05-25
  Administered 2014-05-25: 50 mL via ORAL

## 2014-05-25 MED ORDER — ONDANSETRON 8 MG PO TBDP: 8.0000 mg | ORAL_TABLET | Freq: Three times a day (TID) | ORAL | Status: DC | PRN

## 2014-05-25 MED ORDER — OXYCODONE-ACETAMINOPHEN 5-325 MG PO TABS: 1.0000 | ORAL_TABLET | ORAL | Status: DC | PRN

## 2014-05-25 MED ORDER — SODIUM CHLORIDE 0.9 % IV BOLUS (SEPSIS)
1000.0000 mL | Freq: Once | INTRAVENOUS | Status: AC
  Administered 2014-05-25: 1000 mL via INTRAVENOUS

## 2014-05-25 MED ORDER — ONDANSETRON HCL 4 MG/2ML IJ SOLN
INTRAMUSCULAR | Status: AC
  Administered 2014-05-25: 4 mg
  Filled 2014-05-25: qty 2

## 2014-05-25 MED ORDER — ONDANSETRON HCL 4 MG/2ML IJ SOLN: 4.0000 mg | Freq: Once | INTRAMUSCULAR | Status: DC

## 2014-05-25 MED ORDER — METRONIDAZOLE 500 MG PO TABS: 500.0000 mg | ORAL_TABLET | Freq: Two times a day (BID) | ORAL | Status: DC

## 2014-05-25 NOTE — ED Notes (Signed)
Attempted report 

## 2014-05-25 NOTE — ED Notes (Signed)
Having pain in her lower mid abd since Friday morning. Painful when she urinates as well. Went to [facility] today and was sent for an u/s

## 2014-05-25 NOTE — ED Notes (Signed)
Pt ambulatory to restroom

## 2014-05-25 NOTE — ED Notes (Signed)
Pt vomiting.

## 2014-05-25 NOTE — ED Notes (Signed)
PT states she still feels nauseous and very weak. Pt agrees to be admitted to hospital.

## 2014-05-25 NOTE — Discharge Instructions (Signed)
Diverticulitis °Diverticulitis is when small pockets that have formed in your colon (large intestine) become infected or swollen. °HOME CARE °· Follow your doctor's instructions. °· Follow a special diet if told by your doctor. °· When you feel better, your doctor may tell you to change your diet. You may be told to eat a lot of fiber. Fruits and vegetables are good sources of fiber. Fiber makes it easier to poop (have bowel movements). °· Take supplements or probiotics as told by your doctor. °· Only take medicines as told by your doctor. °· Keep all follow-up visits with your doctor. °GET HELP IF: °· Your pain does not get better. °· You have a hard time eating food. °· You are not pooping like normal. °GET HELP RIGHT AWAY IF: °· Your pain gets worse. °· Your problems do not get better. °· Your problems suddenly get worse. °· You have a fever. °· You keep throwing up (vomiting). °· You have bloody or black, tarry poop (stool). °MAKE SURE YOU:  °· Understand these instructions. °· Will watch your condition. °· Will get help right away if you are not doing well or get worse. °Document Released: 01/19/2008 Document Revised: 08/07/2013 Document Reviewed: 06/27/2013 °[employer]® Patient Information ©2015 [employer]. This information is not intended to replace advice given to you by your health care provider. Make sure you discuss any questions you have with your health care provider. ° °

## 2014-05-25 NOTE — ED Provider Notes (Signed)
CSN: [id number]     Arrival date & time 05/25/14  1325 History  This chart was scribed for Shaune Pollack, MD by Edison Simon, ED Scribe. This patient was seen in room MH10/MH10 and the patient's care was started at 4:20 PM.    Chief Complaint  Patient presents with  . Abdominal Pain   Patient is a 79 y.o. female presenting with abdominal pain. The history is provided by the patient. No language interpreter was used.  Abdominal Pain Pain location: diffuse lower abdoman. Pain radiates to:  Does not radiate Pain severity:  Moderate Onset quality:  Sudden Timing:  Constant Chronicity:  New Relieved by:  Nothing Worsened by:  Nothing tried Ineffective treatments:  None tried Associated symptoms: no chest pain, no chills, no constipation, no diarrhea, no fever, no nausea and no shortness of breath   Risk factors: being elderly and multiple surgeries    HPI Comments: Brittney [name] is a 79 y.o. female who presents to the Emergency Department complaining of diffuse lower abdominal pain with onset 0330 Friday morning, approximately 36 hours ago, that woke her. She states he felt fine when she went to bed and denies similar pain previously. She states she tried to urinate after waking and was unable to and then had abdominal pain; she states she has had constant pain since then. She states pain seems to be associated  She reports chronic urinary frequency, followed by a urologist but without a diagnosis. She reports history of taking Centhroid, Nexium, glaucoma, fibromyalgia, and migraines. She reports prior UTI but states it has been a long time since her last one. She reports prior cholecystectomy, appendectomy, hysterectomy, and tubal ligation. She states she has not eaten since 1800 last night because her doctor this morning instructed her not to eat or drink. She states her PCP is Dr. Leighton Ruff. She denies fever, nausea, significantly foul urine odor, bowel changes, chest pain, shortness  of breath, or chills.   Her records from her visit to her doctor this morning has blood count and urine but no chemistries. It also does not indicate if urine was sent for culture.   Past Medical History  Diagnosis Date  . Familial hypercholesterolemia   . Hypothyroid   . Glaucoma   . GERD (gastroesophageal reflux disease)   . Post herpetic neuralgia   . Migraine headache   . CKD (chronic kidney disease), stage III    Past Surgical History  Procedure Laterality Date  . Appendectomy    . Tubal ligation    . Cholecystectomy    . Vaginal hysterectomy    . Anal fissure repair    . Hemithyroidectomy    . Incontinence surgery    . Vaginal vault repair    . Cataract surgery      left  . Basal cell carcinoma excision    . Thyroidectomy     Family History  Problem Relation Age of Onset  . Heart disease Mother     chf  . Heart disease Sister     CABG   History  Substance Use Topics  . Smoking status: Never Smoker   . Smokeless tobacco: Not on file  . Alcohol Use: Not on file   OB History   Grav Para Term Preterm Abortions TAB SAB Ect Mult Living                 Review of Systems  Constitutional: Negative for fever and chills.  Respiratory: Negative for shortness  of breath.   Cardiovascular: Negative for chest pain.  Gastrointestinal: Positive for abdominal pain. Negative for nausea, diarrhea and constipation.  Genitourinary: Negative for frequency (not above baseline).       Denies significantly foul odor to urine  All other systems reviewed and are negative.     Allergies  Amitriptyline; Augmentin; Latex; Lipitor; Motrin; Sulfa antibiotics; and Vioxx  Home Medications   Prior to Admission medications   Medication Sig Start Date End Date Taking? Authorizing Provider  ezetimibe (ZETIA) 10 MG tablet Take 10 mg by mouth daily.   Yes Historical Provider, MD  bimatoprost (LUMIGAN) 0.03 % ophthalmic solution Place 1 drop into both eyes at bedtime.    Historical  Provider, MD  butalbital-acetaminophen-caffeine (FIORICET WITH CODEINE) 50-325-40-30 MG per capsule Take 1 capsule by mouth every 4 (four) hours as needed for headache.    Historical Provider, MD  cholecalciferol (VITAMIN D) 1000 UNITS tablet Take 1,000 Units by mouth daily.    Historical Provider, MD  cyclobenzaprine (FLEXERIL) 5 MG tablet Take 5 mg by mouth 3 (three) times daily as needed for muscle spasms.    Historical Provider, MD  esomeprazole (NEXIUM) 40 MG capsule Take 40 mg by mouth daily at 12 noon.    Historical Provider, MD  fluticasone (FLONASE) 50 MCG/ACT nasal spray Place into both nostrils daily.    Historical Provider, MD  gabapentin (NEURONTIN) 100 MG capsule Take 100 mg by mouth every other day.    Historical Provider, MD  hyoscyamine (ANASPAZ) 0.125 MG TBDP disintergrating tablet Place 0.125 mg under the tongue every 4 (four) hours as needed.    Historical Provider, MD  levothyroxine (SYNTHROID, LEVOTHROID) 75 MCG tablet Take 75 mcg by mouth daily before breakfast.    Historical Provider, MD  meclizine (ANTIVERT) 12.5 MG tablet Take 12.5 mg by mouth 3 (three) times daily as needed for dizziness.    Historical Provider, MD  polyethylene glycol (MIRALAX / GLYCOLAX) packet Take 17 g by mouth daily.    Historical Provider, MD  timolol (BETIMOL) 0.25 % ophthalmic solution 1-2 drops 2 (two) times daily.    Historical Provider, MD   BP 167/61  Pulse 75  Temp(Src) 97.9 F (36.6 C) (Oral)  Resp 24  Ht 5\' 1"  (1.549 m)  Wt 154 lb (69.854 kg)  BMI 29.11 kg/m2  SpO2 100% Physical Exam  Nursing note and vitals reviewed. Constitutional: She is oriented to person, place, and time. She appears well-developed and well-nourished. No distress.  HENT:  Head: Normocephalic and atraumatic.  Right Ear: External ear normal.  Left Ear: External ear normal.  Nose: Nose normal.  Eyes: EOM are normal.  Neck: Normal range of motion. Neck supple.  Cardiovascular: Normal rate, regular rhythm and  normal heart sounds.   No murmur heard. Pulmonary/Chest: Effort normal and breath sounds normal. No respiratory distress. She has no wheezes. She has no rales.  Abdominal: Soft. She exhibits no distension. There is tenderness (suprapubic tenderness, no upper abdominal tenderness). There is no rebound and no guarding.  Musculoskeletal: Normal range of motion.  Neurological: She is alert and oriented to person, place, and time. She exhibits normal muscle tone. Coordination normal.  Skin: Skin is warm and dry.  Psychiatric: She has a normal mood and affect. Her behavior is normal. Thought content normal.    ED Course  Procedures (including critical care time) Labs Review Labs Reviewed - No data to display  Imaging Review No results found.   EKG Interpretation None  DIAGNOSTIC STUDIES: Oxygen Saturation is 98% on room air, normal by my interpretation.    COORDINATION OF CARE: 4:36 PM Discussed treatment plan with patient at beside, including pain medication by IV. I also discussed with her that we will obtain urine for UA using in-and-out catheter. Though her doctor ordered UA this morning with culture according the patient, I do not have access to those results. Will also repeat blood count. Her records from [place] indicate that she has a kidney problem, but she is not aware that she has kidney problems; therefore, will check kidney function tests before ordering IV contrast. The patient agrees with the plan and has no further questions at this time.  6:54 PM Discussed with the patient that there is evidence of diverticulitis and inflamed colon but not of UTI. Discussed with her that this is sometimes treated outpatient and sometimes treated as inpatient; she states she would rather go home. Will prescribe Percocet and instructed her to follow up with her PCP on Monday or return here if symptoms worsen.  Patient continues to vomit and unable to keep fluids or oral pain medicine down.  Now  agrees to admission. MDM   Final diagnoses:  Diverticulitis of large intestine without perforation or abscess without bleeding   I personally performed the services described in this documentation, which was scribed in my presence. The recorded information has been reviewed and considered.   Shaune Pollack, MD 05/26/14 [phone]

## 2014-05-26 ENCOUNTER — Encounter (HOSPITAL_COMMUNITY): Payer: Self-pay | Admitting: Emergency Medicine

## 2014-05-26 DIAGNOSIS — E038 Other specified hypothyroidism: Secondary | ICD-10-CM

## 2014-05-26 DIAGNOSIS — E039 Hypothyroidism, unspecified: Secondary | ICD-10-CM | POA: Diagnosis present

## 2014-05-26 DIAGNOSIS — K219 Gastro-esophageal reflux disease without esophagitis: Secondary | ICD-10-CM | POA: Diagnosis present

## 2014-05-26 DIAGNOSIS — K862 Cyst of pancreas: Secondary | ICD-10-CM | POA: Diagnosis present

## 2014-05-26 DIAGNOSIS — G43909 Migraine, unspecified, not intractable, without status migrainosus: Secondary | ICD-10-CM | POA: Diagnosis present

## 2014-05-26 DIAGNOSIS — K5732 Diverticulitis of large intestine without perforation or abscess without bleeding: Principal | ICD-10-CM

## 2014-05-26 DIAGNOSIS — N183 Chronic kidney disease, stage 3 unspecified: Secondary | ICD-10-CM | POA: Diagnosis present

## 2014-05-26 LAB — CBC
HEMATOCRIT: 34.7 % — AB (ref 36.0–46.0)
HEMOGLOBIN: 11.8 g/dL — AB (ref 12.0–15.0)
MCH: 27.4 pg (ref 26.0–34.0)
MCHC: 34 g/dL (ref 30.0–36.0)
MCV: 80.7 fL (ref 78.0–100.0)
Platelets: 132 10*3/uL — ABNORMAL LOW (ref 150–400)
RBC: 4.3 MIL/uL (ref 3.87–5.11)
RDW: 13.4 % (ref 11.5–15.5)
WBC: 6.2 10*3/uL (ref 4.0–10.5)

## 2014-05-26 LAB — LIPID PANEL
Cholesterol: 161 mg/dL (ref 0–200)
HDL: 60 mg/dL (ref >39)
LDL Cholesterol: 88 mg/dL (ref 0–99)
TRIGLYCERIDES: 66 mg/dL (ref <150)
Total CHOL/HDL Ratio: 2.7 RATIO
VLDL: 13 mg/dL (ref 0–40)

## 2014-05-26 LAB — COMPREHENSIVE METABOLIC PANEL
ALBUMIN: 3.1 g/dL — AB (ref 3.5–5.2)
ALK PHOS: 104 U/L (ref 39–117)
ALT: 9 U/L (ref 0–35)
ANION GAP: 10 (ref 5–15)
AST: 13 U/L (ref 0–37)
BUN: 13 mg/dL (ref 6–23)
CO2: 26 mEq/L (ref 19–32)
Calcium: 8.3 mg/dL — ABNORMAL LOW (ref 8.4–10.5)
Chloride: 102 mEq/L (ref 96–112)
Creatinine, Ser: 0.97 mg/dL (ref 0.50–1.10)
GFR calc Af Amer: 64 mL/min — ABNORMAL LOW (ref >90)
GFR calc non Af Amer: 55 mL/min — ABNORMAL LOW (ref >90)
Glucose, Bld: 96 mg/dL (ref 70–99)
POTASSIUM: 4.1 meq/L (ref 3.7–5.3)
Sodium: 138 mEq/L (ref 137–147)
Total Bilirubin: 0.3 mg/dL (ref 0.3–1.2)
Total Protein: 5.9 g/dL — ABNORMAL LOW (ref 6.0–8.3)

## 2014-05-26 LAB — TSH: TSH: 0.86 u[IU]/mL (ref 0.350–4.500)

## 2014-05-26 MED ORDER — LEVOTHYROXINE SODIUM 75 MCG PO TABS
75.0000 ug | ORAL_TABLET | Freq: Every day | ORAL | Status: DC
  Administered 2014-05-26 – 2014-05-27 (×2): 75 ug via ORAL
  Filled 2014-05-26 (×4): qty 1

## 2014-05-26 MED ORDER — OXYCODONE HCL 5 MG PO TABS
5.0000 mg | ORAL_TABLET | ORAL | Status: DC | PRN
  Administered 2014-05-26: 5 mg via ORAL
  Filled 2014-05-26: qty 1

## 2014-05-26 MED ORDER — TIMOLOL MALEATE 0.25 % OP SOLN
1.0000 [drp] | Freq: Two times a day (BID) | OPHTHALMIC | Status: DC
  Administered 2014-05-26 – 2014-05-27 (×3): 1 [drp] via OPHTHALMIC
  Filled 2014-05-26: qty 5

## 2014-05-26 MED ORDER — ACETAMINOPHEN 325 MG PO TABS
650.0000 mg | ORAL_TABLET | Freq: Four times a day (QID) | ORAL | Status: DC | PRN
Start: 2014-05-26 — End: 2014-05-27
  Administered 2014-05-26 – 2014-05-27 (×2): 650 mg via ORAL
  Filled 2014-05-26 (×2): qty 2

## 2014-05-26 MED ORDER — VITAMIN D3 25 MCG (1000 UNIT) PO TABS
1000.0000 [IU] | ORAL_TABLET | Freq: Every day | ORAL | Status: DC
  Administered 2014-05-26 – 2014-05-27 (×2): 1000 [IU] via ORAL
  Filled 2014-05-26 (×2): qty 1

## 2014-05-26 MED ORDER — FLUTICASONE PROPIONATE 50 MCG/ACT NA SUSP
1.0000 | Freq: Every day | NASAL | Status: DC
  Administered 2014-05-26 – 2014-05-27 (×2): 1 via NASAL
  Filled 2014-05-26: qty 16

## 2014-05-26 MED ORDER — PANTOPRAZOLE SODIUM 40 MG PO TBEC
40.0000 mg | DELAYED_RELEASE_TABLET | Freq: Every day | ORAL | Status: DC
  Administered 2014-05-26 – 2014-05-27 (×2): 40 mg via ORAL
  Filled 2014-05-26: qty 1

## 2014-05-26 MED ORDER — BUTALBITAL-APAP-CAFFEINE 50-325-40 MG PO TABS: 1.0000 | ORAL_TABLET | ORAL | Status: DC | PRN

## 2014-05-26 MED ORDER — METRONIDAZOLE IN NACL 5-0.79 MG/ML-% IV SOLN
500.0000 mg | Freq: Three times a day (TID) | INTRAVENOUS | Status: DC
  Administered 2014-05-26 – 2014-05-27 (×4): 500 mg via INTRAVENOUS
  Filled 2014-05-26 (×6): qty 100

## 2014-05-26 MED ORDER — HEPARIN SODIUM (PORCINE) 5000 UNIT/ML IJ SOLN
5000.0000 [IU] | Freq: Three times a day (TID) | INTRAMUSCULAR | Status: DC
Start: 2014-05-26 — End: 2014-05-27
  Administered 2014-05-26 – 2014-05-27 (×3): 5000 [IU] via SUBCUTANEOUS
  Filled 2014-05-26 (×7): qty 1

## 2014-05-26 MED ORDER — CODEINE SULFATE 15 MG PO TABS: 30.0000 mg | ORAL_TABLET | ORAL | Status: DC | PRN

## 2014-05-26 MED ORDER — TIMOLOL HEMIHYDRATE 0.25 % OP SOLN
1.0000 [drp] | Freq: Two times a day (BID) | OPHTHALMIC | Status: DC
  Filled 2014-05-26 (×19): qty 1

## 2014-05-26 MED ORDER — EZETIMIBE 10 MG PO TABS
10.0000 mg | ORAL_TABLET | Freq: Every day | ORAL | Status: DC
  Administered 2014-05-26 – 2014-05-27 (×2): 10 mg via ORAL
  Filled 2014-05-26 (×2): qty 1

## 2014-05-26 MED ORDER — MECLIZINE HCL 12.5 MG PO TABS
12.5000 mg | ORAL_TABLET | Freq: Three times a day (TID) | ORAL | Status: DC | PRN
  Filled 2014-05-26: qty 1

## 2014-05-26 MED ORDER — SODIUM CHLORIDE 0.9 % IV SOLN
INTRAVENOUS | Status: DC
  Administered 2014-05-26 – 2014-05-27 (×3): via INTRAVENOUS

## 2014-05-26 MED ORDER — GABAPENTIN 100 MG PO CAPS
100.0000 mg | ORAL_CAPSULE | ORAL | Status: DC
  Administered 2014-05-26: 100 mg via ORAL
  Filled 2014-05-26: qty 1

## 2014-05-26 MED ORDER — CIPROFLOXACIN IN D5W 400 MG/200ML IV SOLN
400.0000 mg | Freq: Two times a day (BID) | INTRAVENOUS | Status: DC
  Administered 2014-05-26 – 2014-05-27 (×3): 400 mg via INTRAVENOUS
  Filled 2014-05-26 (×4): qty 200

## 2014-05-26 MED ORDER — HYDROMORPHONE HCL 1 MG/ML IJ SOLN: 1.0000 mg | INTRAMUSCULAR | Status: DC | PRN

## 2014-05-26 MED ORDER — ONDANSETRON HCL 4 MG/2ML IJ SOLN: 4.0000 mg | Freq: Three times a day (TID) | INTRAMUSCULAR | Status: DC | PRN

## 2014-05-26 MED ORDER — CYCLOBENZAPRINE HCL 10 MG PO TABS: 5.0000 mg | ORAL_TABLET | Freq: Three times a day (TID) | ORAL | Status: DC | PRN

## 2014-05-26 MED ORDER — ACETAMINOPHEN 650 MG RE SUPP
650.0000 mg | Freq: Four times a day (QID) | RECTAL | Status: DC | PRN
Start: 2014-05-26 — End: 2014-05-27

## 2014-05-26 MED ORDER — LATANOPROST 0.005 % OP SOLN
1.0000 [drp] | Freq: Every day | OPHTHALMIC | Status: DC
  Administered 2014-05-26 (×2): 1 [drp] via OPHTHALMIC
  Filled 2014-05-26: qty 2.5

## 2014-05-26 MED ORDER — BUTALBITAL-APAP-CAFF-COD 50-325-40-30 MG PO CAPS: 1.0000 | ORAL_CAPSULE | ORAL | Status: DC | PRN

## 2014-05-26 NOTE — Progress Notes (Signed)
79yo female c/o lower abdominal pain since Friday am, CT reveals acute diverticulitis and colitis, to begin IV ABX.  Will start Cipro 400mg  IV Q12H for CrCl ~40 ml/min and monitor CBC and Cx.  Wynona Neat, PharmD, BCPS 05/26/2014 1:00 AM

## 2014-05-26 NOTE — Progress Notes (Signed)
Pt called RN to room complaining that her IV site was burning. Currently Cipro is running at 200 mL/h. IV site without redness or edema. Cipro rate changed to 100 mL/h. Will continue to monitor.

## 2014-05-26 NOTE — Progress Notes (Signed)
Utilization review completed.  

## 2014-05-26 NOTE — Progress Notes (Signed)
NURSING PROGRESS NOTE  Brittney [name] [id number] Admission Data: 05/25/14 2355 Attending Provider: Ivor Costa, MD PCP:No primary provider on file. Code Status: full  Brittney [name] is a 79 y.o. female patient admitted from ED:  -No acute distress noted.  -No complaints of shortness of breath.  -No complaints of chest pain.   Blood pressure 154/62, pulse 84, temperature 97.6 F (36.4 C), temperature source Oral, resp. rate 18, height 5\' 1"  (1.549 m), weight 68.4 kg (150 lb 12.7 oz), SpO2 98.00%.   IV Fluids:  IV in place, occlusive dsg intact without redness, IV cath forearm right, condition patent and no redness normal saline.   Allergies:  Amitriptyline; Augmentin; Latex; Lipitor; Motrin; Sulfa antibiotics; and Vioxx  Past Medical History:   has a past medical history of Familial hypercholesterolemia; Hypothyroid; Glaucoma; GERD (gastroesophageal reflux disease); Post herpetic neuralgia; Migraine headache; and CKD (chronic kidney disease), stage III.  Past Surgical History:   has past surgical history that includes Appendectomy; Tubal ligation; Cholecystectomy; Vaginal hysterectomy; Anal fissure repair; hemithyroidectomy; Incontinence surgery; vaginal vault repair; cataract surgery; Excision basal cell carcinoma; and Thyroidectomy.  Social History:   reports that she has never smoked. She does not have any smokeless tobacco history on file.  Skin: WDL  Patient/Family oriented to room. Information packet given to patient/family. Admission inpatient armband information verified with patient/family to include name and date of birth and placed on patient arm. Side rails up x 2, fall assessment and education completed with patient/family. Patient/family able to verbalize understanding of risk associated with falls and verbalized understanding to call for assistance before getting out of bed. Call light within reach. Patient/family able to voice and demonstrate understanding of unit  orientation instructions.  Awaiting doctor's orders.

## 2014-05-26 NOTE — Progress Notes (Signed)
Pt seen and examined, admitted this am per Dr.Niu Acute diverticulitis -continue IV cipro/Flagyl -start clears, advance diet as tol -H/o colonoscopy 2015 normal -Home tomorrow if stable  Domenic Polite, MD [phone]

## 2014-05-26 NOTE — H&P (Signed)
[facility] History and Physical  Brittney [name] DXA:[id number] DOB: [birth year] DOA: 05/25/2014  Referring physician: ED physician PCP: No primary provider on file.  Specialists:   Chief Complaint: Abdominal pain  HPI: Brittney [name] is a 79 y.o. female with PMH of CKD-III, HTN, hypothyroidism, migraine headaches, familial hypercholesterolemia, GERD, who presents with dominant pain.   Patient reports that her abdominal pain started 2 days ago. In the early morning of the 2 days ago, when she woke up and tried to urinate, but was unable to. Then she developed abdominal pain. It is located in the mid of the lower abdomen. It is 10 out of 10 in severity, sharp, radiating to the sides of her abdomen. It is associated with nausea and vomiting. She does not have fever, chills, diarrhea. She denies cough, chest pain, shortness of breath, leg edema, rashes. No sick contact. She visited [place] [facility] yesterday morning, was sent to [facility] ED.   He had CT-abd in [facility] ED, which showed sigmoid colon diverticulitis. She is transferred to [place] and admitted to the inpatient for further evaluated and treatment.   Review of Systems: As presented in the history of presenting illness, rest negative.  Where does patient live? Lives with her husband at home.   Can patient participate in ADLs? Yes  Allergy:  Allergies  Allergen Reactions  . Amitriptyline   . Augmentin [Amoxicillin-Pot Clavulanate]   . Latex   . Lipitor [Atorvastatin]   . Motrin [Ibuprofen]   . Sulfa Antibiotics   . Vioxx [Rofecoxib]     Past Medical History  Diagnosis Date  . Familial hypercholesterolemia   . Hypothyroid   . Glaucoma   . GERD (gastroesophageal reflux disease)   . Post herpetic neuralgia   . Migraine headache   . CKD (chronic kidney disease), stage III     Past Surgical History  Procedure Laterality Date  . Appendectomy    . Tubal ligation    . Cholecystectomy    . Vaginal hysterectomy    . Anal  fissure repair    . Hemithyroidectomy    . Incontinence surgery    . Vaginal vault repair    . Cataract surgery      left  . Basal cell carcinoma excision    . Thyroidectomy      Social History:  reports that she has never smoked. She does not have any smokeless tobacco history on file. Her alcohol and drug histories are not on file.  Family History:  Family History  Problem Relation Age of Onset  . Heart disease Mother     chf  . Heart disease Sister     CABG     Prior to Admission medications   Medication Sig Start Date End Date Taking? Authorizing Provider  ezetimibe (ZETIA) 10 MG tablet Take 10 mg by mouth daily.   Yes Historical Provider, MD  bimatoprost (LUMIGAN) 0.03 % ophthalmic solution Place 1 drop into both eyes at bedtime.    Historical Provider, MD  butalbital-acetaminophen-caffeine (FIORICET WITH CODEINE) 50-325-40-30 MG per capsule Take 1 capsule by mouth every 4 (four) hours as needed for headache.    Historical Provider, MD  cholecalciferol (VITAMIN D) 1000 UNITS tablet Take 1,000 Units by mouth daily.    Historical Provider, MD  ciprofloxacin (CIPRO) 500 MG tablet Take 1 tablet (500 mg total) by mouth every 12 (twelve) hours. 05/25/14   Shaune Pollack, MD  cyclobenzaprine (FLEXERIL) 5 MG tablet Take 5 mg by mouth 3 (  three) times daily as needed for muscle spasms.    Historical Provider, MD  esomeprazole (NEXIUM) 40 MG capsule Take 40 mg by mouth daily at 12 noon.    Historical Provider, MD  fluticasone (FLONASE) 50 MCG/ACT nasal spray Place into both nostrils daily.    Historical Provider, MD  gabapentin (NEURONTIN) 100 MG capsule Take 100 mg by mouth every other day.    Historical Provider, MD  hyoscyamine (ANASPAZ) 0.125 MG TBDP disintergrating tablet Place 0.125 mg under the tongue every 4 (four) hours as needed.    Historical Provider, MD  levothyroxine (SYNTHROID, LEVOTHROID) 75 MCG tablet Take 75 mcg by mouth daily before breakfast.    Historical Provider, MD   meclizine (ANTIVERT) 12.5 MG tablet Take 12.5 mg by mouth 3 (three) times daily as needed for dizziness.    Historical Provider, MD  metroNIDAZOLE (FLAGYL) 500 MG tablet Take 1 tablet (500 mg total) by mouth 2 (two) times daily. 05/25/14   Shaune Pollack, MD  ondansetron (ZOFRAN ODT) 8 MG disintegrating tablet Take 1 tablet (8 mg total) by mouth every 8 (eight) hours as needed for nausea or vomiting. 05/25/14   Shaune Pollack, MD  oxyCODONE-acetaminophen (PERCOCET/ROXICET) 5-325 MG per tablet Take 1 tablet by mouth every 4 (four) hours as needed. 05/25/14   Shaune Pollack, MD  polyethylene glycol (MIRALAX / Floria Raveling) packet Take 17 g by mouth daily.    Historical Provider, MD  timolol (BETIMOL) 0.25 % ophthalmic solution 1-2 drops 2 (two) times daily.    Historical Provider, MD    Physical Exam: Filed Vitals:   05/25/14 2044 05/25/14 2147 05/25/14 2320 05/25/14 2354  BP: 146/62 124/78 124/78 154/62  Pulse: 95 87 78 84  Temp: 97.6 F (36.4 C)   97.6 F (36.4 C)  TempSrc: Oral   Oral  Resp: 16 17 18 18   Height:    5\' 1"  (1.549 m)  Weight:    68.4 kg (150 lb 12.7 oz)  SpO2: 100% 100% 99% 98%   General: Not in acute distress HEENT:       Eyes: PERRL, EOMI, no scleral icterus       ENT: No discharge from the ears and nose, no pharynx injection, no tonsillar enlargement.        Neck: No JVD, no bruit, no mass felt. Cardiac: S1/S2, RRR, No murmurs, gallops or rubs Pulm: Good air movement bilaterally. Clear to auscultation bilaterally. No rales, wheezing, rhonchi or rubs. Abd: Soft, nondistended, There is tenderness over the lower abdoment. No rebound pain, no organomegaly, BS present Ext: No edema. 2+DP/PT pulse bilaterally Musculoskeletal: No joint deformities, erythema, or stiffness, ROM full Skin: No rashes.  Neuro: Alert and oriented X3, cranial nerves II-XII grossly intact, muscle strength 5/5 in all extremeties, sensation to light touch intact.  Psych: Patient is not psychotic, no  suicidal or hemocidal ideation.  Labs on Admission:  Basic Metabolic Panel:  Recent Labs Lab 05/25/14 1650  NA 141  K 3.8  CL 101  CO2 26  GLUCOSE 98  BUN 16  CREATININE 1.00  CALCIUM 9.7   Liver Function Tests:  Recent Labs Lab 05/25/14 1650  AST 17  ALT 13  ALKPHOS 134*  BILITOT 0.4  PROT 7.6  ALBUMIN 4.2    Recent Labs Lab 05/25/14 1650  LIPASE 31   No results found for this basename: AMMONIA,  in the last 168 hours CBC:  Recent Labs Lab 05/25/14 1650  WBC 8.2  NEUTROABS 5.6  HGB 14.4  HCT 42.3  MCV 81.3  PLT 162   Cardiac Enzymes: No results found for this basename: CKTOTAL, CKMB, CKMBINDEX, TROPONINI,  in the last 168 hours  BNP (last 3 results) No results found for this basename: PROBNP,  in the last 8760 hours CBG: No results found for this basename: GLUCAP,  in the last 168 hours  Radiological Exams on Admission: Ct Abdomen Pelvis W Contrast  05/25/2014   CLINICAL DATA:  Chronic generalized abdominal pain. Constipation. Diarrhea. Surgical history includes appendectomy, hysterectomy and a cholecystectomy.  EXAM: CT ABDOMEN AND PELVIS WITH CONTRAST  TECHNIQUE: Multidetector CT imaging of the abdomen and pelvis was performed using the standard protocol following bolus administration of intravenous contrast.  CONTRAST:  177mL OMNIPAQUE IOHEXOL 300 MG/ML IV. Oral contrast was also administered.  COMPARISON: CT abdomen and pelvis 04/01/2014, 10/10/2012, 06/15/2011, 09/04/2008.  FINDINGS: Edema/inflammation in the mesenteric fat adjacent to the proximal sigmoid colon in an area of diverticulosis. No extraluminal gas or abnormal fluid collection. Edema involving the wall of the ascending colon. Remainder of the colon unremarkable. Stomach normal in appearance distended with oral contrast. Normal-appearing small bowel. Appendix surgically absent.  Simple cysts in the medial segment right lobe of liver; no significant focal hepatic parenchymal abnormality.  Normal appearing spleen, adrenal glands, and kidneys. Gallbladder surgically absent. No unexpected biliary ductal dilation. Approximate 14 mm cystic lesion in the head of the pancreas, unchanged since the examination 2 months ago, minimally increased in size since the 2014 examination where it measured 10 mm. Mild pancreatic atrophy. No new pancreatic masses. Mild aorto iliac atherosclerosis without aneurysm. Patent visceral arteries. No significant lymphadenopathy.  Uterus surgically absent. No adnexal masses or free pelvic fluid. Urinary bladder unremarkable. Numerous pelvic phleboliths.  Bone window images demonstrate lower thoracic spondylosis, degenerative disc disease and spondylosis at L5-S1. Visualized lung bases clear. Heart size normal.  IMPRESSION: 1. Acute sigmoid diverticulitis. No evidence of abscess or perforation. 2. Ascending colitis is suspected, as there is edema in the wall of the ascending colon. 3. Stable approximate 14 mm cystic mass in the head of the pancreas since the examination 2 months ago, minimally increased in size since 2014. This is likely an intraductal mucinous neoplasm, typically benign. Followup imaging in August, 2016 is suggested, as noted on the report of the 04/01/2014 CT. 4. Mild pancreatic atrophy.   Electronically Signed   By: Evangeline Dakin M.D.   On: 05/25/2014 18:22    Assessment/Plan Principal Problem:   Diverticulitis Active Problems:   Familial hypercholesterolemia   Hypothyroid   GERD (gastroesophageal reflux disease)   Migraine headache   CKD (chronic kidney disease), stage III  1.  diverticulitis: As evidenced by CT abdomen. Currently patient is hemodynamically stable. No fever, no leukocytosis. She still has nausea, cannot tolerate oral medications.  -will admit to MedSurg bed -Start IV Cipro and Flagyl -Zofran for nausea -IV fluids: Normal saline 125 cc per hour -May switch to oral medications after she is able to tolerate oral meds  2.  familial hypercholesterolemia: She is allergic to statin. -will continue home Zetia - check FLP  3. GERD: Continue PPI  4. CKD-III: Cre is1.00 on admission - will follow up renal Fx by BMP  5. Hypothyroidsm - Continue Synthroid - Check TSH    DVT ppx: SQ Heparin   Code Status: Full code Family Communication:  Yes, patient's husband at bed side Disposition Plan: Admit to inpatient   Date of Service 05/26/2014    Dawn Kiper,  [facility] Pager [phone]  If 7PM-7AM, please contact night-coverage [email or web address] Password [email or web address] 05/26/2014, 12:41 AM

## 2014-05-27 DIAGNOSIS — K210 Gastro-esophageal reflux disease with esophagitis: Secondary | ICD-10-CM

## 2014-05-27 LAB — BASIC METABOLIC PANEL
ANION GAP: 12 (ref 5–15)
BUN: 10 mg/dL (ref 6–23)
CO2: 22 mEq/L (ref 19–32)
Calcium: 8.3 mg/dL — ABNORMAL LOW (ref 8.4–10.5)
Chloride: 106 mEq/L (ref 96–112)
Creatinine, Ser: 1 mg/dL (ref 0.50–1.10)
GFR, EST AFRICAN AMERICAN: 61 mL/min — AB (ref >90)
GFR, EST NON AFRICAN AMERICAN: 53 mL/min — AB (ref >90)
Glucose, Bld: 92 mg/dL (ref 70–99)
Potassium: 4.3 mEq/L (ref 3.7–5.3)
SODIUM: 140 meq/L (ref 137–147)

## 2014-05-27 LAB — CBC
HCT: 35.2 % — ABNORMAL LOW (ref 36.0–46.0)
Hemoglobin: 11.7 g/dL — ABNORMAL LOW (ref 12.0–15.0)
MCH: 27.4 pg (ref 26.0–34.0)
MCHC: 33.2 g/dL (ref 30.0–36.0)
MCV: 82.4 fL (ref 78.0–100.0)
Platelets: 134 10*3/uL — ABNORMAL LOW (ref 150–400)
RBC: 4.27 MIL/uL (ref 3.87–5.11)
RDW: 13.7 % (ref 11.5–15.5)
WBC: 4.6 10*3/uL (ref 4.0–10.5)

## 2014-05-27 MED ORDER — HYDROCODONE-ACETAMINOPHEN 5-300 MG PO TABS: 1.0000 | ORAL_TABLET | Freq: Four times a day (QID) | ORAL | Status: DC | PRN

## 2014-05-27 MED ORDER — CIPROFLOXACIN HCL 500 MG PO TABS: 500.0000 mg | ORAL_TABLET | Freq: Two times a day (BID) | ORAL | Status: DC

## 2014-05-27 MED ORDER — METRONIDAZOLE 500 MG PO TABS: 500.0000 mg | ORAL_TABLET | Freq: Three times a day (TID) | ORAL | Status: DC

## 2014-05-27 MED ORDER — SENNOSIDES-DOCUSATE SODIUM 8.6-50 MG PO TABS: 1.0000 | ORAL_TABLET | Freq: Two times a day (BID) | ORAL | Status: DC | PRN

## 2014-05-27 NOTE — Discharge Summary (Signed)
Physician Discharge Summary  Brittney [name] KZS:[id number] DOB: 09-24-1934 DOA: 05/25/2014  PCP: Brittney Heck, MD  Admit date: 05/25/2014 Discharge date: 05/27/2014  Time spent: 22minutes  Recommendations for Outpatient Follow-up:  1. Dr.Barnes in 1 week 2. Stable 74mm cystic mass in head of pancreas , followed by Dr.Schooler due to have MRI in 66months  Discharge Diagnoses:  Principal Problem:   Diverticulitis Active Problems:   Familial hypercholesterolemia   Hypothyroid   GERD (gastroesophageal reflux disease)   Migraine headache   CKD (chronic kidney disease), stage III   Pancreatic cyst   Discharge Condition: stable  Diet recommendation: soft diet, advance as tolerated  Filed Weights   05/25/14 1351 05/25/14 2354  Weight: 69.854 kg (154 lb) 68.4 kg (150 lb 12.7 oz)    History of present illness:  HPI: Brittney [name] is a 79 y.o. female with PMH of CKD-III, HTN, hypothyroidism, migraine headaches, familial hypercholesterolemia, GERD, who presented with abdominal pain.  Patient reported that her abdominal pain started 2 days ago, when she woke up and tried to urinate, but was unable to. Then she developed abdominal pain. It is located in the mid of the lower abdomen. It is 10 out of 10 in severity, sharp, radiating to the sides of her abdomen. It was associated with nausea and vomiting.  He had [facility]abd [facility] ED, which showed sigmoid colon diverticulitis  Hospital Course:  Acute diverticulitis  -treated with IV cipro/Flagyl, clinically stable for discharge -diet was slowly advanced, Abx changed to PO -H/o colonoscopy 2015 normal  Rest of her medical problems were stable She is due to have FU MRI of Abd in 52months to FU her pancreatic cystic lesion per Dr.Schooler  Discharge Exam: Filed Vitals:   05/27/14 0511  BP: 134/56  Pulse: 66  Temp: 98.4 F (36.9 C)  Resp: 17    General: AAOx3 Cardiovascular: S1S2/RRR Respiratory:CTAB  Discharge  Instructions You were cared for by a hospitalist during your hospital stay. If you have any questions about your discharge medications or the care you received while you were in the hospital after you are discharged, you can call the unit and asked to speak with the hospitalist on call if the hospitalist that took care of you is not available. Once you are discharged, your primary care physician will handle any further medical issues. Please note that NO REFILLS for any discharge medications will be authorized once you are discharged, as it is imperative that you return to your primary care physician (or establish a relationship with a primary care physician if you do not have one) for your aftercare needs so that they can reassess your need for medications and monitor your lab values.  Discharge Instructions   Discharge instructions    Complete by:  As directed   Soft bland diet, advance as tolerated     Increase activity slowly    Complete by:  As directed           Current Discharge Medication List    START taking these medications   Details  ciprofloxacin (CIPRO) 500 MG tablet Take 1 tablet (500 mg total) by mouth every 12 (twelve) hours. For 8days Qty: 16 tablet, Refills: 0    Hydrocodone-Acetaminophen 5-300 MG TABS Take 1 tablet by mouth every 6 (six) hours as needed. Qty: 20 each, Refills: 0    metroNIDAZOLE (FLAGYL) 500 MG tablet Take 1 tablet (500 mg total) by mouth 3 (three) times daily. For 8 days Qty: 24 tablet, Refills:  0    senna-docusate (SENOKOT S) 8.6-50 MG per tablet Take 1 tablet by mouth 2 (two) times daily as needed for moderate constipation. Qty: 10 tablet, Refills: 0      CONTINUE these medications which have NOT CHANGED   Details  bimatoprost (LUMIGAN) 0.03 % ophthalmic solution Place 1 drop into both eyes at bedtime.    butalbital-acetaminophen-caffeine (FIORICET WITH CODEINE) 50-325-40-30 MG per capsule Take 1 capsule by mouth every 4 (four) hours as needed  for headache.    cyclobenzaprine (FLEXERIL) 5 MG tablet Take 5 mg by mouth 3 (three) times daily as needed for muscle spasms.    esomeprazole (NEXIUM) 40 MG capsule Take 40 mg by mouth daily at 12 noon.    ezetimibe (ZETIA) 10 MG tablet Take 10 mg by mouth daily.    fluticasone (FLONASE) 50 MCG/ACT nasal spray Place 2 sprays into both nostrils daily as needed for rhinitis.     gabapentin (NEURONTIN) 100 MG capsule Take 100 mg by mouth every other day.    hyoscyamine (ANASPAZ) 0.125 MG TBDP disintergrating tablet Place 0.125 mg under the tongue every 4 (four) hours as needed for cramping.     levothyroxine (SYNTHROID, LEVOTHROID) 75 MCG tablet Take 75 mcg by mouth daily before breakfast.    meclizine (ANTIVERT) 12.5 MG tablet Take 12.5 mg by mouth 3 (three) times daily as needed for dizziness.    timolol (BETIMOL) 0.25 % ophthalmic solution Place 1 drop into both eyes 2 (two) times daily.       STOP taking these medications     cholecalciferol (VITAMIN D) 1000 UNITS tablet        Allergies  Allergen Reactions  . Amitriptyline Other (See Comments)    Skewed vision  . Latex Other (See Comments)    Blisters skin  . Lipitor [Atorvastatin] Other (See Comments)    myalgias  . Motrin [Ibuprofen] Swelling  . Sulfa Antibiotics Other (See Comments)    Reaction unknown  . Vioxx [Rofecoxib] Other (See Comments)    Reaction unknown  . Augmentin [Amoxicillin-Pot Clavulanate] Rash   Follow-up Information   Follow up with Brittney Heck, MD. Schedule an appointment as soon as possible for a visit in 1 week.   Specialty:  Family Medicine   Contact information:   [place]  [phone]        The results of significant diagnostics from this hospitalization (including imaging, microbiology, ancillary and laboratory) are listed below for reference.    Significant Diagnostic Studies: Ct Abdomen Pelvis W Contrast  05/25/2014   CLINICAL DATA:   Chronic generalized abdominal pain. Constipation. Diarrhea. Surgical history includes appendectomy, hysterectomy and a cholecystectomy.  EXAM: CT ABDOMEN AND PELVIS WITH CONTRAST  TECHNIQUE: Multidetector CT imaging of the abdomen and pelvis was performed using the standard protocol following bolus administration of intravenous contrast.  CONTRAST:  177mL OMNIPAQUE IOHEXOL 300 MG/ML IV. Oral contrast was also administered.  COMPARISON: CT abdomen and pelvis 04/01/2014, 10/10/2012, 06/15/2011, 09/04/2008.  FINDINGS: Edema/inflammation in the mesenteric fat adjacent to the proximal sigmoid colon in an area of diverticulosis. No extraluminal gas or abnormal fluid collection. Edema involving the wall of the ascending colon. Remainder of the colon unremarkable. Stomach normal in appearance distended with oral contrast. Normal-appearing small bowel. Appendix surgically absent.  Simple cysts in the medial segment right lobe of liver; no significant focal hepatic parenchymal abnormality. Normal appearing spleen, adrenal glands, and kidneys. Gallbladder surgically absent. No unexpected biliary ductal dilation. Approximate 14  mm cystic lesion in the head of the pancreas, unchanged since the examination 2 months ago, minimally increased in size since the 2014 examination where it measured 10 mm. Mild pancreatic atrophy. No new pancreatic masses. Mild aorto iliac atherosclerosis without aneurysm. Patent visceral arteries. No significant lymphadenopathy.  Uterus surgically absent. No adnexal masses or free pelvic fluid. Urinary bladder unremarkable. Numerous pelvic phleboliths.  Bone window images demonstrate lower thoracic spondylosis, degenerative disc disease and spondylosis at L5-S1. Visualized lung bases clear. Heart size normal.  IMPRESSION: 1. Acute sigmoid diverticulitis. No evidence of abscess or perforation. 2. Ascending colitis is suspected, as there is edema in the wall of the ascending colon. 3. Stable approximate  14 mm cystic mass in the head of the pancreas since the examination 2 months ago, minimally increased in size since 2014. This is likely an intraductal mucinous neoplasm, typically benign. Followup imaging in August, 2016 is suggested, as noted on the report of the 04/01/2014 CT. 4. Mild pancreatic atrophy.   Electronically Signed   By: Evangeline Dakin M.D.   On: 05/25/2014 18:22    Microbiology: No results found for this or any previous visit (from the past 240 hour(s)).   Labs: Basic Metabolic Panel:  Recent Labs Lab 05/25/14 1650 05/26/14 0535 05/27/14 0522  NA 141 138 140  K 3.8 4.1 4.3  CL 101 102 106  CO2 26 26 22   GLUCOSE 98 96 92  BUN 16 13 10   CREATININE 1.00 0.97 1.00  CALCIUM 9.7 8.3* 8.3*   Liver Function Tests:  Recent Labs Lab 05/25/14 1650 05/26/14 0535  AST 17 13  ALT 13 9  ALKPHOS 134* 104  BILITOT 0.4 0.3  PROT 7.6 5.9*  ALBUMIN 4.2 3.1*    Recent Labs Lab 05/25/14 1650  LIPASE 31   No results found for this basename: AMMONIA,  in the last 168 hours CBC:  Recent Labs Lab 05/25/14 1650 05/26/14 0535 05/27/14 0522  WBC 8.2 6.2 4.6  NEUTROABS 5.6  --   --   HGB 14.4 11.8* 11.7*  HCT 42.3 34.7* 35.2*  MCV 81.3 80.7 82.4  PLT 162 132* 134*   Cardiac Enzymes: No results found for this basename: CKTOTAL, CKMB, CKMBINDEX, TROPONINI,  in the last 168 hours BNP: BNP (last 3 results) No results found for this basename: PROBNP,  in the last 8760 hours CBG: No results found for this basename: GLUCAP,  in the last 168 hours     Signed:  Flecia Shutter  [facility] 05/27/2014, 10:25 AM

## 2014-05-27 NOTE — Progress Notes (Signed)
NURSING PROGRESS NOTE  Brittney [name] [id number] Discharge Data: 05/27/2014 12:02 PM Attending Provider: No att. providers found HER:DEYCXK,GYJEHUDJS [name], MD     Melina Fiddler to be D/C'd Home per MD order.  Discussed with the patient the After Visit Summary and all questions fully answered. All IV's discontinued with no bleeding noted. All belongings returned to patient for patient to take home.   Last Vital Signs:  Blood pressure 134/56, pulse 66, temperature 98.4 F (36.9 C), temperature source Oral, resp. rate 17, height 5\' 1"  (1.549 m), weight 68.4 kg (150 lb 12.7 oz), SpO2 98.00%.  Discharge Medication List   Medication List         bimatoprost 0.03 % ophthalmic solution  Commonly known as:  LUMIGAN  Place 1 drop into both eyes at bedtime.     butalbital-acetaminophen-caffeine 50-325-40-30 MG per capsule  Commonly known as:  FIORICET WITH CODEINE  Take 1 capsule by mouth every 4 (four) hours as needed for headache.     ciprofloxacin 500 MG tablet  Commonly known as:  CIPRO  Take 1 tablet (500 mg total) by mouth every 12 (twelve) hours. For 8days     cyclobenzaprine 5 MG tablet  Commonly known as:  FLEXERIL  Take 5 mg by mouth 3 (three) times daily as needed for muscle spasms.     esomeprazole 40 MG capsule  Commonly known as:  NEXIUM  Take 40 mg by mouth daily at 12 noon.     ezetimibe 10 MG tablet  Commonly known as:  ZETIA  Take 10 mg by mouth daily.     fluticasone 50 MCG/ACT nasal spray  Commonly known as:  FLONASE  Place 2 sprays into both nostrils daily as needed for rhinitis.     gabapentin 100 MG capsule  Commonly known as:  NEURONTIN  Take 100 mg by mouth every other day.     Hydrocodone-Acetaminophen 5-300 MG Tabs  Take 1 tablet by mouth every 6 (six) hours as needed.     hyoscyamine 0.125 MG Tbdp disintergrating tablet  Commonly known as:  ANASPAZ  Place 0.125 mg under the tongue every 4 (four) hours as needed for cramping.     levothyroxine 75 MCG tablet  Commonly known as:  SYNTHROID, LEVOTHROID  Take 75 mcg by mouth daily before breakfast.     meclizine 12.5 MG tablet  Commonly known as:  ANTIVERT  Take 12.5 mg by mouth 3 (three) times daily as needed for dizziness.     metroNIDAZOLE 500 MG tablet  Commonly known as:  FLAGYL  Take 1 tablet (500 mg total) by mouth 3 (three) times daily. For 8 days     senna-docusate 8.6-50 MG per tablet  Commonly known as:  SENOKOT S  Take 1 tablet by mouth 2 (two) times daily as needed for moderate constipation.     timolol 0.25 % ophthalmic solution  Commonly known as:  BETIMOL  Place 1 drop into both eyes 2 (two) times daily.         Wallie Renshaw, RN

## 2014-05-27 NOTE — Care Management Note (Signed)
    Page 1 of 1   05/27/2014     11:25:41 AM CARE MANAGEMENT NOTE 05/27/2014  Patient:  Brittney [name], Brittney [name]   Account Number:  [id number]  Date Initiated:  05/27/2014  Documentation initiated by:  Tomi Bamberger  Subjective/Objective Assessment:   dx diverticulitis  admit - lives with spouse.     Action/Plan:   Anticipated DC Date:  05/27/2014   Anticipated DC Plan:  [place]  CM consult      Choice offered to / List presented to:             Status of service:  Completed, signed off Medicare Important Message given?  NA - LOS <3 / Initial given by admissions (If response is "NO", the following Medicare IM given date fields will be blank) Date Medicare IM given:   Medicare IM given by:   Date Additional Medicare IM given:   Additional Medicare IM given by:    Discharge Disposition:  HOME/SELF CARE  Per UR Regulation:  Reviewed for med. necessity/level of care/duration of stay  If discussed at [place] of Stay Meetings, dates discussed:    Comments:  05/27/14 [place], BSN  [phone] patient is for dc today, no needs anticipated.

## 2014-06-04 DIAGNOSIS — K5792 Diverticulitis of intestine, part unspecified, without perforation or abscess without bleeding: Secondary | ICD-10-CM | POA: Diagnosis not present

## 2014-06-04 DIAGNOSIS — K862 Cyst of pancreas: Secondary | ICD-10-CM | POA: Diagnosis not present

## 2014-06-26 DIAGNOSIS — H4011X2 Primary open-angle glaucoma, moderate stage: Secondary | ICD-10-CM | POA: Diagnosis not present

## 2014-09-20 ENCOUNTER — Other Ambulatory Visit: Payer: Self-pay | Admitting: Gastroenterology

## 2014-09-20 DIAGNOSIS — K869 Disease of pancreas, unspecified: Secondary | ICD-10-CM

## 2014-10-10 ENCOUNTER — Ambulatory Visit
Admission: RE | Admit: 2014-10-10 | Discharge: 2014-10-10 | Disposition: A | Discharge status: Home / Self Care | Payer: Medicare Other | Source: Ambulatory Visit | Attending: Gastroenterology | Admitting: Gastroenterology

## 2014-10-10 DIAGNOSIS — Z9049 Acquired absence of other specified parts of digestive tract: Secondary | ICD-10-CM | POA: Diagnosis not present

## 2014-10-10 DIAGNOSIS — K869 Disease of pancreas, unspecified: Secondary | ICD-10-CM

## 2014-10-10 DIAGNOSIS — K7689 Other specified diseases of liver: Secondary | ICD-10-CM | POA: Diagnosis not present

## 2014-10-10 DIAGNOSIS — K862 Cyst of pancreas: Secondary | ICD-10-CM | POA: Diagnosis not present

## 2014-10-10 MED ORDER — GADOBENATE DIMEGLUMINE 529 MG/ML IV SOLN: 14.0000 mL | Freq: Once | INTRAVENOUS | Status: AC | PRN

## 2014-11-06 DIAGNOSIS — J209 Acute bronchitis, unspecified: Secondary | ICD-10-CM | POA: Diagnosis not present

## 2014-11-18 DIAGNOSIS — R195 Other fecal abnormalities: Secondary | ICD-10-CM | POA: Diagnosis not present

## 2014-11-18 DIAGNOSIS — R1030 Lower abdominal pain, unspecified: Secondary | ICD-10-CM | POA: Diagnosis not present

## 2014-11-19 DIAGNOSIS — R195 Other fecal abnormalities: Secondary | ICD-10-CM | POA: Diagnosis not present

## 2014-11-27 DIAGNOSIS — H2511 Age-related nuclear cataract, right eye: Secondary | ICD-10-CM | POA: Diagnosis not present

## 2014-11-27 DIAGNOSIS — H4011X1 Primary open-angle glaucoma, mild stage: Secondary | ICD-10-CM | POA: Diagnosis not present

## 2014-12-16 DIAGNOSIS — R195 Other fecal abnormalities: Secondary | ICD-10-CM | POA: Diagnosis not present

## 2014-12-16 DIAGNOSIS — L290 Pruritus ani: Secondary | ICD-10-CM | POA: Diagnosis not present

## 2014-12-19 DIAGNOSIS — R195 Other fecal abnormalities: Secondary | ICD-10-CM | POA: Diagnosis not present

## 2014-12-26 ENCOUNTER — Other Ambulatory Visit: Payer: Self-pay | Admitting: Physician Assistant

## 2014-12-26 DIAGNOSIS — R195 Other fecal abnormalities: Secondary | ICD-10-CM

## 2014-12-26 DIAGNOSIS — R109 Unspecified abdominal pain: Secondary | ICD-10-CM

## 2014-12-30 ENCOUNTER — Ambulatory Visit
Admission: RE | Admit: 2014-12-30 | Discharge: 2014-12-30 | Disposition: A | Discharge status: Home / Self Care | Payer: Medicare Other | Source: Ambulatory Visit | Attending: Physician Assistant | Admitting: Physician Assistant

## 2014-12-30 DIAGNOSIS — Z9049 Acquired absence of other specified parts of digestive tract: Secondary | ICD-10-CM | POA: Diagnosis not present

## 2014-12-30 DIAGNOSIS — R109 Unspecified abdominal pain: Secondary | ICD-10-CM

## 2014-12-30 DIAGNOSIS — R195 Other fecal abnormalities: Secondary | ICD-10-CM

## 2015-01-03 DIAGNOSIS — R1084 Generalized abdominal pain: Secondary | ICD-10-CM | POA: Diagnosis not present

## 2015-01-03 DIAGNOSIS — R194 Change in bowel habit: Secondary | ICD-10-CM | POA: Diagnosis not present

## 2015-01-09 DIAGNOSIS — L820 Inflamed seborrheic keratosis: Secondary | ICD-10-CM | POA: Diagnosis not present

## 2015-01-09 DIAGNOSIS — L718 Other rosacea: Secondary | ICD-10-CM | POA: Diagnosis not present

## 2015-01-09 DIAGNOSIS — Z85828 Personal history of other malignant neoplasm of skin: Secondary | ICD-10-CM | POA: Diagnosis not present

## 2015-01-09 DIAGNOSIS — L821 Other seborrheic keratosis: Secondary | ICD-10-CM | POA: Diagnosis not present

## 2015-01-09 DIAGNOSIS — L218 Other seborrheic dermatitis: Secondary | ICD-10-CM | POA: Diagnosis not present

## 2015-01-14 DIAGNOSIS — R194 Change in bowel habit: Secondary | ICD-10-CM | POA: Diagnosis not present

## 2015-01-21 DIAGNOSIS — Z01419 Encounter for gynecological examination (general) (routine) without abnormal findings: Secondary | ICD-10-CM | POA: Diagnosis not present

## 2015-01-21 DIAGNOSIS — Z6828 Body mass index (BMI) 28.0-28.9, adult: Secondary | ICD-10-CM | POA: Diagnosis not present

## 2015-01-29 DIAGNOSIS — R194 Change in bowel habit: Secondary | ICD-10-CM | POA: Diagnosis not present

## 2015-02-04 DIAGNOSIS — Z1231 Encounter for screening mammogram for malignant neoplasm of breast: Secondary | ICD-10-CM | POA: Diagnosis not present

## 2015-02-12 DIAGNOSIS — N958 Other specified menopausal and perimenopausal disorders: Secondary | ICD-10-CM | POA: Diagnosis not present

## 2015-02-12 DIAGNOSIS — M8588 Other specified disorders of bone density and structure, other site: Secondary | ICD-10-CM | POA: Diagnosis not present

## 2015-03-05 DIAGNOSIS — L989 Disorder of the skin and subcutaneous tissue, unspecified: Secondary | ICD-10-CM | POA: Diagnosis not present

## 2015-03-05 DIAGNOSIS — R931 Abnormal findings on diagnostic imaging of heart and coronary circulation: Secondary | ICD-10-CM | POA: Diagnosis not present

## 2015-03-05 DIAGNOSIS — E039 Hypothyroidism, unspecified: Secondary | ICD-10-CM | POA: Diagnosis not present

## 2015-03-05 DIAGNOSIS — R7301 Impaired fasting glucose: Secondary | ICD-10-CM | POA: Diagnosis not present

## 2015-03-05 DIAGNOSIS — R194 Change in bowel habit: Secondary | ICD-10-CM | POA: Diagnosis not present

## 2015-03-05 DIAGNOSIS — Z0000 Encounter for general adult medical examination without abnormal findings: Secondary | ICD-10-CM | POA: Diagnosis not present

## 2015-03-05 DIAGNOSIS — Z1389 Encounter for screening for other disorder: Secondary | ICD-10-CM | POA: Diagnosis not present

## 2015-03-05 DIAGNOSIS — D696 Thrombocytopenia, unspecified: Secondary | ICD-10-CM | POA: Diagnosis not present

## 2015-03-05 DIAGNOSIS — N183 Chronic kidney disease, stage 3 (moderate): Secondary | ICD-10-CM | POA: Diagnosis not present

## 2015-03-05 DIAGNOSIS — E785 Hyperlipidemia, unspecified: Secondary | ICD-10-CM | POA: Diagnosis not present

## 2015-04-14 DIAGNOSIS — D696 Thrombocytopenia, unspecified: Secondary | ICD-10-CM | POA: Diagnosis not present

## 2015-04-23 ENCOUNTER — Telehealth: Payer: Self-pay | Admitting: Hematology

## 2015-04-23 NOTE — Telephone Encounter (Signed)
Contacted pt regarding new patient appt 9/14 at 1pm, pt confirmed appt

## 2015-04-30 ENCOUNTER — Encounter: Payer: Self-pay | Admitting: Hematology

## 2015-04-30 ENCOUNTER — Ambulatory Visit (HOSPITAL_BASED_OUTPATIENT_CLINIC_OR_DEPARTMENT_OTHER): Payer: Medicare Other | Admitting: Hematology

## 2015-04-30 ENCOUNTER — Ambulatory Visit: Payer: Medicare Other

## 2015-04-30 ENCOUNTER — Other Ambulatory Visit (HOSPITAL_BASED_OUTPATIENT_CLINIC_OR_DEPARTMENT_OTHER): Payer: Medicare Other

## 2015-04-30 VITALS — BP 157/74 | HR 98 | Temp 98.1°F | Resp 18 | Ht 61.0 in | Wt 155.5 lb

## 2015-04-30 DIAGNOSIS — D696 Thrombocytopenia, unspecified: Secondary | ICD-10-CM | POA: Diagnosis not present

## 2015-04-30 DIAGNOSIS — B839 Helminthiasis, unspecified: Secondary | ICD-10-CM

## 2015-04-30 LAB — COMPREHENSIVE METABOLIC PANEL (CC13)
ALBUMIN: 3.8 g/dL (ref 3.5–5.0)
ALT: 11 U/L (ref 0–55)
ANION GAP: 6 meq/L (ref 3–11)
AST: 16 U/L (ref 5–34)
Alkaline Phosphatase: 115 U/L (ref 40–150)
BILIRUBIN TOTAL: 0.2 mg/dL (ref 0.20–1.20)
BUN: 16.5 mg/dL (ref 7.0–26.0)
CALCIUM: 9.4 mg/dL (ref 8.4–10.4)
CO2: 31 mEq/L — ABNORMAL HIGH (ref 22–29)
Chloride: 105 mEq/L (ref 98–109)
Creatinine: 1.2 mg/dL — ABNORMAL HIGH (ref 0.6–1.1)
EGFR: 43 mL/min/{1.73_m2} — ABNORMAL LOW (ref >90)
Glucose: 100 mg/dl (ref 70–140)
POTASSIUM: 4 meq/L (ref 3.5–5.1)
Sodium: 142 mEq/L (ref 136–145)
TOTAL PROTEIN: 6.4 g/dL (ref 6.4–8.3)

## 2015-04-30 LAB — CBC & DIFF AND RETIC
BASO%: 1 % (ref 0.0–2.0)
Basophils Absolute: 0.1 10*3/uL (ref 0.0–0.1)
EOS ABS: 0.2 10*3/uL (ref 0.0–0.5)
EOS%: 3 % (ref 0.0–7.0)
HCT: 40.2 % (ref 34.8–46.6)
HEMOGLOBIN: 13.4 g/dL (ref 11.6–15.9)
IMMATURE RETIC FRACT: 2.9 % (ref 1.60–10.00)
LYMPH%: 32.6 % (ref 14.0–49.7)
MCH: 27.2 pg (ref 25.1–34.0)
MCHC: 33.3 g/dL (ref 31.5–36.0)
MCV: 81.7 fL (ref 79.5–101.0)
MONO#: 0.4 10*3/uL (ref 0.1–0.9)
MONO%: 8.4 % (ref 0.0–14.0)
NEUT%: 55 % (ref 38.4–76.8)
NEUTROS ABS: 2.8 10*3/uL (ref 1.5–6.5)
RBC: 4.92 10*6/uL (ref 3.70–5.45)
RDW: 13.3 % (ref 11.2–14.5)
Retic %: 1.17 % (ref 0.70–2.10)
Retic Ct Abs: 57.56 10*3/uL (ref 33.70–90.70)
WBC: 5 10*3/uL (ref 3.9–10.3)
lymph#: 1.6 10*3/uL (ref 0.9–3.3)

## 2015-04-30 LAB — CHCC SMEAR

## 2015-04-30 NOTE — Progress Notes (Signed)
[name]    HEMATOLOGY/ONCOLOGY CONSULTATION NOTE  Date of Service: 04/30/2015  Patient Care Team: Leighton Ruff, MD as PCP - General (Family Medicine)  CHIEF COMPLAINTS/PURPOSE OF CONSULTATION:  Evaluation and management of thrombocytopenia  HISTORY OF PRESENTING ILLNESS:  Brittney [name] is a wonderful 80 y.o. female who has been referred to [place] by Dr .Gerrit Heck, MD  for evaluation and management of thrombocytopenia.  Patient has a history of hypothyroidism, GERD, chronic kidney disease stage III, dyslipidemiawho has been referred to [place] for evaluation of thrombocytopenia after she had recent labs on 04/14/2015 which showed platelet count 117k.   Previous to this she had a CBC on 03/05/2015 that showed a platelet count of 115k.  Her platelet counts last year were noted to be 134k.  Her hemoglobin levels are normal and so her WBC counts.  Patient has not had any issues with bleeding or easy bruising.  He has tolerated multiple surgeries in the past without any bleeding problems.  No new bone pains.  No increased infections.  No fevers or chills.  No night sweats.  No unexpected weight loss.  She reports that several months ago earlier this year she was in [place] and dropped her Necklace into the toilet pot in a public restroom and had to pick it up and feels that she might have picked up infection in the process. She noted having whitish worm like objects in her stools and was evaluated by GI and treated for Entamoeba infection with Flagyl and for Helicobacter pylori. No overt parasitic infestation was diagnosed.  Patient is convinced that she still has a potentia lfor worm infestation and notes that she has whitish or blackish objects in her stools that might be worms. Recently she still notes that she had small white plaques on her skin that feel like they're crawling in that she's been able to dig them out at times. Was seen by dermatology and no obvious larva migrans type  issues were noted. She has been referred to infectious diseases for further workup.  She was very concerned about this and wonders if we could do additional stool studies.  MEDICAL HISTORY:  Past Medical History  Diagnosis Date  . Familial hypercholesterolemia   . Hypothyroid   . Glaucoma   . GERD (gastroesophageal reflux disease)   . Post herpetic neuralgia   . Migraine headache   . CKD (chronic kidney disease), stage III   decreased hearing patient uses hearing aids bilaterally. Elevated fasting glucose Urinary incontinence Osteopenia History of Helicobacter pylori treated in March 2016 History of Entamoeba histolytica treated in March/April 2016  SURGICAL HISTORY: Past Surgical History  Procedure Laterality Date  . Appendectomy    . Tubal ligation    . Cholecystectomy    . Vaginal hysterectomy    . Anal fissure repair    . Hemithyroidectomy    . Incontinence surgery    . Vaginal vault repair    . Cataract surgery      left  . Basal cell carcinoma excision    . Thyroidectomy      SOCIAL HISTORY: Social History   Social History  . Marital Status: Married    Spouse Name: N/A  . Number of Children: 4  . Years of Education: N/A   Occupational History  . Not on file.   Social History Main Topics  . Smoking status: Never Smoker   . Smokeless tobacco: Not on file  . Alcohol Use: Not on file  . Drug Use:  Not on file  . Sexual Activity: Not on file   Other Topics Concern  . Not on file   Social History Narrative  . No narrative on file    FAMILY HISTORY: Family History  Problem Relation Age of Onset  . Heart disease Mother     chf  . Heart disease Sister     CABG    ALLERGIES:  is allergic to amitriptyline; latex; lipitor; motrin; sulfa antibiotics; vioxx; and augmentin.  MEDICATIONS:  Current Outpatient Prescriptions  Medication Sig Dispense Refill  . bimatoprost (LUMIGAN) 0.03 % ophthalmic solution Place 1 drop into both eyes at bedtime.    .  butalbital-acetaminophen-caffeine (FIORICET WITH CODEINE) 50-325-40-30 MG per capsule Take 1 capsule by mouth every 4 (four) hours as needed for headache.    . ciprofloxacin (CIPRO) 500 MG tablet Take 1 tablet (500 mg total) by mouth every 12 (twelve) hours. For 8days 16 tablet 0  . cyclobenzaprine (FLEXERIL) 5 MG tablet Take 5 mg by mouth 3 (three) times daily as needed for muscle spasms.    [name] esomeprazole (NEXIUM) 40 MG capsule Take 40 mg by mouth daily at 12 noon.    . ezetimibe (ZETIA) 10 MG tablet Take 10 mg by mouth daily.    . fluticasone (FLONASE) 50 MCG/ACT nasal spray Place 2 sprays into both nostrils daily as needed for rhinitis.     [name] gabapentin (NEURONTIN) 100 MG capsule Take 100 mg by mouth every other day.    . Hydrocodone-Acetaminophen 5-300 MG TABS Take 1 tablet by mouth every 6 (six) hours as needed. 20 each 0  . hyoscyamine (ANASPAZ) 0.125 MG TBDP disintergrating tablet Place 0.125 mg under the tongue every 4 (four) hours as needed for cramping.     [name] levothyroxine (SYNTHROID, LEVOTHROID) 75 MCG tablet Take 75 mcg by mouth daily before breakfast.    . meclizine (ANTIVERT) 12.5 MG tablet Take 12.5 mg by mouth 3 (three) times daily as needed for dizziness.    . metroNIDAZOLE (FLAGYL) 500 MG tablet Take 1 tablet (500 mg total) by mouth 3 (three) times daily. For 8 days 24 tablet 0  . senna-docusate (SENOKOT S) 8.6-50 MG per tablet Take 1 tablet by mouth 2 (two) times daily as needed for moderate constipation. 10 tablet 0  . timolol (BETIMOL) 0.25 % ophthalmic solution Place 1 drop into both eyes 2 (two) times daily.      No current facility-administered medications for this visit.    REVIEW OF SYSTEMS:    10 Point review of Systems was done is negative except as noted above.  PHYSICAL EXAMINATION: ECOG PERFORMANCE STATUS: 1 - Symptomatic but completely ambulatory  . Filed Vitals:   04/30/15 1311  Height: 5\' 1"  (1.549 m)  Weight: 155 lb 8 oz (70.534 kg)   Filed Weights     04/30/15 1311  Weight: 155 lb 8 oz (70.534 kg)   .Body mass index is 29.4 kg/(m^2).  GENERAL:alert, in no acute distress and comfortable SKIN: skin color, texture, turgor are normal, no rashes or significant lesions EYES: normal, conjunctiva are pink and non-injected, sclera clear OROPHARYNX:no exudate, no erythema and lips, buccal mucosa, and tongue normal  NECK: supple, no JVD, thyroid normal size, non-tender, without nodularity LYMPH:  no palpable lymphadenopathy in the cervical, axillary or inguinal LUNGS: clear to auscultation with normal respiratory effort HEART: regular rate & rhythm,  no murmurs and no lower extremity edema ABDOMEN: abdomen soft, non-tender, normoactive bowel sounds  Musculoskeletal: no cyanosis of digits  and no clubbing  PSYCH: alert & oriented x 3 with fluent speech NEURO: no focal motor/sensory deficits  LABORATORY DATA:  I have reviewed the data as listed  . CBC Latest Ref Rng 04/30/2015 05/27/2014 05/26/2014  WBC 3.9 - 10.3 10e3/uL 5.0 4.6 6.2  Hemoglobin 11.6 - 15.9 g/dL 13.4 11.7(L) 11.8(L)  Hematocrit 34.8 - 46.6 % 40.2 35.2(L) 34.7(L)  Platelets 145 - 400 10e3/uL 119 Platelet count consistent in citrate, large plts, Occ giant plt(L) 134(L) 132(L)    . CMP Latest Ref Rng 05/27/2014 05/26/2014 05/25/2014  Glucose 70 - 99 mg/dL 92 96 98  BUN 6 - 23 mg/dL 10 13 16   Creatinine 0.50 - 1.10 mg/dL 1.00 0.97 1.00  Sodium 137 - 147 mEq/L 140 138 141  Potassium 3.7 - 5.3 mEq/L 4.3 4.1 3.8  Chloride 96 - 112 mEq/L 106 102 101  CO2 19 - 32 mEq/L 22 26 26   Calcium 8.4 - 10.5 mg/dL 8.3(L) 8.3(L) 9.7  Total Protein 6.0 - 8.3 g/dL - 5.9(L) 7.6  Total Bilirubin 0.3 - 1.2 mg/dL - 0.3 0.4  Alkaline Phos 39 - 117 U/L - 104 134(H)  AST 0 - 37 U/L - 13 17  ALT 0 - 35 U/L - 9 13   RADIOGRAPHIC STUDIES: I have personally reviewed the radiological images as listed and agreed with the findings in the report. No results found.  ASSESSMENT & PLAN:    81 year old female with  1) Mild thrombocytopenia with platelet counts and been between 115k and 134k over the last year with no issues with bleeding.  Could be related to medications such as her PPI on Neurontin.  Could be potentially autoimmune given the patient also has a history of hypothyroidism. Plan -This is currently mild and does not require additional extensive workup at this time. -The counts were reproducible in citrate tube that rules out pseudothrombocytopenia. -we'll check a peripheral blood smear to rule out overt morphologic abnormalities. -Recheck platelets in 3-6 months. -Reconsult [place] if platelet counts are dropping below 75,000 or other cytopenias develop.  2) patient concerned about worm infestation and cutaneous larva migrans -  Plan -patient had been referred to ID  plan-we will get strongyloides ab and some additional stool studies to help assure the patient. -if the testing is negative and the patient can note be convinced regarding lack of parasitic infestation - could be a concern of delusional parasitosis.  RTC prn with Dr Irene Limbo. Continue f/u with PCP  All of the patients questions were answered to her apparent satisfaction. The patient knows to call the clinic with any problems, questions or concerns.  I spent 30 minutes counseling the patient face to face. The total time spent in the appointment was 45 minutes and more than 50% was on counseling and direct patient cares.    Sullivan Lone MD [place] AAHIVMS [facility] Hematology/Oncology Physician [place]  (Office):       [phone] (Work cell):  [phone] (Fax):           [phone]  04/30/2015 1:31 PM

## 2015-05-04 LAB — STRONGYLOIDES ANTIBODY: STRONGYLOIDES IGG ANTIBODY, ELISA: NEGATIVE

## 2015-05-09 ENCOUNTER — Other Ambulatory Visit: Payer: Self-pay | Admitting: *Deleted

## 2015-05-09 ENCOUNTER — Ambulatory Visit: Payer: Medicare Other

## 2015-05-09 DIAGNOSIS — K5792 Diverticulitis of intestine, part unspecified, without perforation or abscess without bleeding: Secondary | ICD-10-CM

## 2015-05-09 DIAGNOSIS — B839 Helminthiasis, unspecified: Secondary | ICD-10-CM

## 2015-05-09 DIAGNOSIS — D696 Thrombocytopenia, unspecified: Secondary | ICD-10-CM

## 2015-05-09 DIAGNOSIS — K5732 Diverticulitis of large intestine without perforation or abscess without bleeding: Secondary | ICD-10-CM | POA: Diagnosis not present

## 2015-05-10 LAB — FECAL LACTOFERRIN, QUANT

## 2015-05-12 LAB — OVA AND PARASITE EXAMINATION

## 2015-05-13 LAB — STOOL CULTURE

## 2015-05-19 DIAGNOSIS — L219 Seborrheic dermatitis, unspecified: Secondary | ICD-10-CM | POA: Diagnosis not present

## 2015-05-19 DIAGNOSIS — Z85828 Personal history of other malignant neoplasm of skin: Secondary | ICD-10-CM | POA: Diagnosis not present

## 2015-05-19 DIAGNOSIS — F40298 Other specified phobia: Secondary | ICD-10-CM | POA: Diagnosis not present

## 2015-05-20 DIAGNOSIS — Z23 Encounter for immunization: Secondary | ICD-10-CM | POA: Diagnosis not present

## 2015-05-21 DIAGNOSIS — H401131 Primary open-angle glaucoma, bilateral, mild stage: Secondary | ICD-10-CM | POA: Diagnosis not present

## 2015-06-17 ENCOUNTER — Encounter: Payer: Self-pay | Admitting: Neurology

## 2015-06-17 ENCOUNTER — Ambulatory Visit (INDEPENDENT_AMBULATORY_CARE_PROVIDER_SITE_OTHER): Payer: Medicare Other | Admitting: Neurology

## 2015-06-17 VITALS — BP 138/70 | HR 80 | Resp 20 | Ht 61.5 in | Wt 152.0 lb

## 2015-06-17 DIAGNOSIS — F6811 Factitious disorder with predominantly psychological signs and symptoms: Secondary | ICD-10-CM | POA: Diagnosis not present

## 2015-06-17 DIAGNOSIS — R41842 Visuospatial deficit: Secondary | ICD-10-CM

## 2015-06-17 DIAGNOSIS — H538 Other visual disturbances: Secondary | ICD-10-CM

## 2015-06-17 DIAGNOSIS — F22 Delusional disorders: Secondary | ICD-10-CM

## 2015-06-17 DIAGNOSIS — F0390 Unspecified dementia without behavioral disturbance: Secondary | ICD-10-CM

## 2015-06-17 DIAGNOSIS — F068 Other specified mental disorders due to known physiological condition: Secondary | ICD-10-CM | POA: Diagnosis not present

## 2015-06-17 MED ORDER — QUETIAPINE FUMARATE 25 MG PO TABS: 25.0000 mg | ORAL_TABLET | Freq: Every day | ORAL | Status: DC

## 2015-06-17 MED ORDER — DONEPEZIL HCL 5 MG PO TABS: 5.0000 mg | ORAL_TABLET | Freq: Every day | ORAL | Status: DC

## 2015-06-17 NOTE — Progress Notes (Signed)
Provider:  Larey Seat, M D  Referring Provider: Leighton Ruff, MD Primary Care Physician:  Gerrit Heck, MD  Chief Complaint  Patient presents with  . New Patient (Initial Visit)    memory, referral from dermatologist, [place] 17/30, MMSE 24/30, rm 42, with husband    HPI:  Brittney [name] is a 80 y.o. female , seen here as a referral/ revisit  from Dr. Wilhemina Bonito for pseudoparasitic sensory abnormalities, psychosis.    Chief complaint according to patient : " The patient feels that small lesions are developing all over her skin including at the root of the eyelashes and upper lip"   She reports a sensation of  movement under her skin. She does not feel that these are nocturnal or daytime active sensations but that they occur any time and that there perpetually there.  She has tried with Q-tips to  manipulate to get to the bottom of the skin lesions but has actually created some skin lesions in the process.  She had seen Dr. Wilhemina Bonito at [facility] dermatology and endorsed symptoms of skin irritation little, bumps -changing in size,  Itchiness,tenderness, sometimes soreness but also a creepy crawly sensation under the skin. This does not affect one region of the body alone but includes arms lower extremities trunk and face. His causes the patient discomfort and also has affected her sleep and actually consumes a significant time of her thoughts and daytime. Her husband agrees.  In addition Dr. Ronnald Ramp eluded to some cognitive difficulties that have developed. He was concerned about the beginning memory loss disorder. Brittney [name] underwent 2 tests here today a Mini-Mental Status examination as well as a Montral cognitive assessment test. On the Mini-Mental Status Examination she missed 5 out of 5 points on attention and calculation and could not recall any of the 3 recall words.. I would have given her 22 out of 30 points for this performance. The Montral cognitive  assessment test is much more difficult she was not able to solve the trail making test or draw a three-dimensional cube. She placed the contour and numbers into a clock face but not the hands. She was able to name 3 out of 3 animals was able to repeat a series of digits but had trouble with his serial 7 again. She was able to abstract, her word fluency was good with 11 f words, and she scored 6 out of 6 points on orientation date and place. This is similar to her Mini-Mental Status Examination.  It is unusual for patient to be fully oriented to date place and time and have primarily visual spatial difficulties and recall difficulties. It is clear visit short-term memory is affected but thus it should affect date and time as well. The patient is aware of her cognitive deficits and day-to-day life. The couple recently stayed in a hotel and the patient was not sure which direction to go to the elevator for example. She has not gotten lost outside. The patient prepares about 1 warm meal per week, she states she now wears gloves and a  Cap , is afraid to contaminate the food. She has never balanced the chequebook, she only worked 10 years of her life outside the home. She worked for [employer].   The patient is the oldest of 5 siblings and as far she knows not no other sibling has difficulties with cognition or memory. Her parents were not affected by dementia. She has 1 maternal first cousin  who suffers from Alzheimer's disease.   Social history: The couple is retired, she does not drink alcohol, she does not smoke or use tobacco in any form, she drinks iced tea usually when eating out. The  couple prepares at home coffee and tea in decaf form.  Review of Systems: Out of a complete 14 system review, the patient complains of only the following symptoms, and all other reviewed systems are negative.   Itching, parasitic ideas, delusions, skin lesions - from manipulation. memory loss.    Social History    Social History  . Marital Status: Married    Spouse Name: N/A  . Number of Children: 4  . Years of Education: N/A   Occupational History  . Not on file.   Social History Main Topics  . Smoking status: Never Smoker   . Smokeless tobacco: Not on file  . Alcohol Use: Not on file  . Drug Use: Not on file  . Sexual Activity: Not on file   Other Topics Concern  . Not on file   Social History Narrative    Family History  Problem Relation Age of Onset  . Heart disease Mother     chf  . Heart disease Sister     CABG    Past Medical History  Diagnosis Date  . Familial hypercholesterolemia   . Hypothyroid   . Glaucoma   . GERD (gastroesophageal reflux disease)   . Post herpetic neuralgia   . Migraine headache   . CKD (chronic kidney disease), stage III     Past Surgical History  Procedure Laterality Date  . Appendectomy    . Tubal ligation    . Cholecystectomy    . Vaginal hysterectomy    . Anal fissure repair    . Hemithyroidectomy    . Incontinence surgery    . Vaginal vault repair    . Cataract surgery      left  . Basal cell carcinoma excision    . Thyroidectomy      Current Outpatient Prescriptions  Medication Sig Dispense Refill  . bimatoprost (LUMIGAN) 0.03 % ophthalmic solution Place 1 drop into both eyes at bedtime.    . butalbital-acetaminophen-caffeine (FIORICET WITH CODEINE) 50-325-40-30 MG per capsule Take 1 capsule by mouth every 4 (four) hours as needed for headache.    . cyclobenzaprine (FLEXERIL) 5 MG tablet Take 5 mg by mouth 3 (three) times daily as needed for muscle spasms.    [name] ezetimibe (ZETIA) 10 MG tablet Take 10 mg by mouth daily.    . fluticasone (FLONASE) 50 MCG/ACT nasal spray Place 2 sprays into both nostrils daily as needed for rhinitis.     [name] gabapentin (NEURONTIN) 100 MG capsule Take 100 mg by mouth every other day.    . levothyroxine (SYNTHROID, LEVOTHROID) 75 MCG tablet Take 75 mcg by mouth daily before breakfast.    .  meclizine (ANTIVERT) 12.5 MG tablet Take 12.5 mg by mouth 3 (three) times daily as needed for dizziness.    . timolol (BETIMOL) 0.25 % ophthalmic solution Place 1 drop into both eyes daily.     . timolol (TIMOPTIC) 0.25 % ophthalmic solution Apply to eye.     No current facility-administered medications for this visit.    Allergies as of 06/17/2015 - Review Complete 06/17/2015  Allergen Reaction Noted  . Amitriptyline Other (See Comments) 01/03/2014  . Latex Other (See Comments) 01/03/2014  . Lipitor [atorvastatin] Other (See Comments) 01/03/2014  . Meperidine Other (See Comments)  04/30/2015  . Motrin [ibuprofen] Swelling 01/03/2014  . Rosuvastatin Other (See Comments) 04/30/2015  . Sulfa antibiotics Other (See Comments) 01/03/2014  . Vioxx [rofecoxib] Other (See Comments) 01/03/2014  . Augmentin [amoxicillin-pot clavulanate] Rash and Other (See Comments) 01/03/2014    Vitals: BP 138/70 mmHg  Pulse 80  Resp 20  Ht 5' 1.5" (1.562 m)  Wt 152 lb (68.947 kg)  BMI 28.26 kg/m2 Last Weight:  Wt Readings from Last 1 Encounters:  06/17/15 152 lb (68.947 kg)   URK:YHCW mass index is 28.26 kg/(m^2).     Last Height:   Ht Readings from Last 1 Encounters:  06/17/15 5' 1.5" (1.562 m)    Physical exam:  General: The patient is awake, alert and appears not in acute distress. The patient is well groomed. Head: Normocephalic, atraumatic. Neck is supple. Mallampati 2 neck circumference:15.5 . Nasal airflow restricted ,  Cardiovascular:  Regular rate and rhythm , without  murmurs or carotid bruit, and without distended neck veins. Respiratory: Lungs are clear to auscultation. Skin:  Without evidence of edema, or rash Trunk:  The patient's posture is erect   Neurologic exam : The patient is awake and alert, oriented to place and time.   Memory subjective  described as impaired  Memory testing revealed   [place]: Montreal Cognitive Assessment  06/17/2015  Visuospatial/ Executive (0/5) 2    Naming (0/3) 3  Attention: Read list of digits (0/2) 1  Attention: Read list of letters (0/1) 0  Attention: Serial 7 subtraction starting at 100 (0/3) 0  Language: Repeat phrase (0/2) 2  Language : Fluency (0/1) 1  Abstraction (0/2) 2  Delayed Recall (0/5) 0  Orientation (0/6) 6  Total 17  Adjusted Score (based on education) 18   MMSE: MMSE - Mini Mental State Exam 06/17/2015  Orientation to time 5  Orientation to Place 5  Registration 3  Attention/ Calculation 0  Recall 3  Language- name 2 objects 2  Language- repeat 1  Language- follow 3 step command 3  Language- read & follow direction 1  Write a sentence 1  Copy design 0  Total score 24       Attention span & concentration ability appears normal.  Speech is fluent,  without  dysarthria, dysphonia or aphasia.  Mood and affect are appropriate.  Cranial nerves: Pupils are equal and briskly reactive to light. Funduscopic exam without  evidence of pallor or edema.  She has narrow angle glaucoma in the right eye. Extraocular movements  in vertical and horizontal planes intact and without nystagmus. Visual fields by finger perimetry are intact. Hearing severely ipaired - with hearing aids.   Facial sensation intact to fine touch.  Facial motor strength is symmetric and tongue and uvula move midline. Shoulder shrug was symmetrical.   Motor exam: Normal tone, muscle bulk and symmetric strength in all extremities.  Sensory:  Fine touch, pinprick and vibration were tested in all extremities. Proprioception tested in the upper extremities was normal.  Coordination: Rapid alternating movements in the fingers/hands was normal. Finger-to-nose maneuver  normal without evidence of ataxia, dysmetria or tremor.  Gait and station: Patient walks without assistive device and is able unassisted to climb up to the exam table. Strength within normal limits.  Stance is stable and normal.  Toe and heel stand were tested .Tandem gait is  unfragmented. Turns with  3  Steps. Romberg testing is  negative.  Deep tendon reflexes: in the  upper and lower extremities are symmetric and intact.  Babinski maneuver response is downgoing.  The patient was advised of the nature of the diagnosed sleep disorder , the treatment options and risks for general a health and wellness arising from not treating the condition.  I spent more than 45  minutes of face to face time with the patient. Greater than 50% of time was spent in counseling and coordination of care. We have discussed the diagnosis and differential and I answered the patient's and her husband's questions.     Assessment:  After physical and neurologic examination, review of laboratory studies,  Personal review of imaging studies, reports of other /same  Imaging studies ,  Results of polysomnography/ neurophysiology testing and pre-existing records as far as provided in visit., my assessment is   1) Deafness can promote audio- hallucinations, but to my knowledge not skin dysesthesias.  2) the patient does have a significant cognitive deficit but the distribution of her deficits is unusual. Visual spatial seems to be her weakness point. She also had trouble with delayed recall and short-term memory clearly is affected but she knew the date, could name and generate words. The serial 7 may have never been a strong point. She reports that math was her weakest subject in school.  3) the pseudo-parasitic dysesthesias are most often seen in patients with early memory loss. Dr. Wilhemina Bonito is familiar with this picture. I would like to treat this patient in 2 ways #1 with an atypical neuroleptic to reduce the compulsion to itch or manipulate the skin, and in addition I would order an MRI of the brain to rule out any structural abnormalities strokes demyelination tumors etc. and start the patient on Aricept for memory support.  I will also order metabolic panel, a TSH and a CBC with  differential.    Plan:  Treatment plan and additional workup :  Open MRI, brain, start Seroquel at night, 25 mg, and order CMET. RV in 4 weeks with NP. Aricept will be prescribed at 5 mg and can be increased at the next visit to a 10 mg daily dose.   Asencion Partridge Gwendlyon Zumbro MD  06/17/2015   CC: Leighton Ruff, [address], [place] [address]

## 2015-06-17 NOTE — Patient Instructions (Signed)
Quetiapine tablets What is this medicine? QUETIAPINE (kwe TYE a peen) is an antipsychotic. It is used to treat delusions and perceptions( hallucinations ) This medicine may be used for other purposes; ask your health care provider or pharmacist if you have questions. What should I tell my health care provider before I take this medicine? They need to know if you have any of these conditions: -brain tumor or head injury -breast cancer -cataracts -diabetes -difficulty swallowing -heart disease -kidney disease -liver disease -low blood counts, like low white cell, platelet, or red cell counts -low blood pressure or dizziness when standing up -Parkinson's disease -previous heart attack -seizures -suicidal thoughts, plans, or attempt by you or a family member -thyroid disease -an unusual or allergic reaction to quetiapine, other medicines, foods, dyes, or preservatives -pregnant or trying to get pregnant -breast-feeding How should I use this medicine? Take this medicine by mouth. Swallow it with a drink of water. Follow the directions on the prescription label. If it upsets your stomach you can take it with food. Take your medicine at regular intervals. Do not take it more often than directed. Do not stop taking except on the advice of your doctor or health care professional. A special MedGuide will be given to you by the pharmacist with each prescription and refill. Be sure to read this information carefully each time. Talk to your pediatrician regarding the use of this medicine in children. While this drug may be prescribed for children as young as 10 years for selected conditions, precautions do apply. Patients over age 16 years may have a stronger reaction to this medicine and need smaller doses. Overdosage: If you think you have taken too much of this medicine contact a poison control center or emergency room at once. NOTE: This medicine is only for you. Do not share this medicine with  others. What if I miss a dose? If you miss a dose, take it as soon as you can. If it is almost time for your next dose, take only that dose. Do not take double or extra doses. What may interact with this medicine? Do not take this medicine with any of the following medications: -certain medicines for fungal infections like fluconazole, itraconazole, ketoconazole, posaconazole, voriconazole -cisapride -dofetilide -dronedarone -droperidol -grepafloxacin -halofantrine -phenothiazines like chlorpromazine, mesoridazine, thioridazine -pimozide -sparfloxacin -ziprasidone This medicine may also interact with the following medications: -alcohol -antiviral medicines for HIV or AIDS -certain medicines for blood pressure -certain medicines for depression, anxiety, or psychotic disturbances like haloperidol, lorazepam -certain medicines for diabetes -certain medicines for Parkinson's disease -certain medicines for seizures like carbamazepine, phenobarbital, phenytoin -cimetidine -erythromycin -other medicines that prolong the QT interval (cause an abnormal heart rhythm) -rifampin -steroid medicines like prednisone or cortisone This list may not describe all possible interactions. Give your health care provider a list of all the medicines, herbs, non-prescription drugs, or dietary supplements you use. Also tell them if you smoke, drink alcohol, or use illegal drugs. Some items may interact with your medicine. What should I watch for while using this medicine? Visit your doctor or health care professional for regular checks on your progress. It may be several weeks before you see the full effects of this medicine. Your health care provider may suggest that you have your eyes examined prior to starting this medicine, and every 6 months thereafter. If you have been taking this medicine regularly for some time, do not suddenly stop taking it. You must gradually reduce the dose or your symptoms may get  worse. Ask your doctor or health care professional for advice. Patients and their families should watch out for worsening depression or thoughts of suicide. Also watch out for sudden or severe changes in feelings such as feeling anxious, agitated, panicky, irritable, hostile, aggressive, impulsive, severely restless, overly excited and hyperactive, or not being able to sleep. If this happens, especially at the beginning of antidepressant treatment or after a change in dose, call your health care professional. Dennis Bast may get dizzy or drowsy. Do not drive, use machinery, or do anything that needs mental alertness until you know how this medicine affects you. Do not stand or sit up quickly, especially if you are an older patient. This reduces the risk of dizzy or fainting spells. Alcohol can increase dizziness and drowsiness. Avoid alcoholic drinks. Do not treat yourself for colds, diarrhea or allergies. Ask your doctor or health care professional for advice, some ingredients may increase possible side effects. This medicine can reduce the response of your body to heat or cold. Dress warm in cold weather and stay hydrated in hot weather. If possible, avoid extreme temperatures like saunas, hot tubs, very hot or cold showers, or activities that can cause dehydration such as vigorous exercise. What side effects may I notice from receiving this medicine? Side effects that you should report to your doctor or health care professional as soon as possible: -allergic reactions like skin rash, itching or hives, swelling of the face, lips, or tongue -difficulty swallowing -fast or irregular heartbeat -fever or chills, sore throat -fever with rash, swollen lymph nodes, or swelling of the face -increased hunger or thirst -increased urination -problems with balance, talking, walking -seizures -stiff muscles -suicidal thoughts or other mood changes -uncontrollable head, mouth, neck, arm, or leg movements -unusually  weak or tired Side effects that usually do not require medical attention (report to your doctor or health care professional if they continue or are bothersome): -change in sex drive or performance -constipation -drowsy or dizzy -dry mouth -stomach upset -weight gain This list may not describe all possible side effects. Call your doctor for medical advice about side effects. You may report side effects to FDA at 1-800-FDA-1088. Where should I keep my medicine? Keep out of the reach of children. Store at room temperature between 15 and 30 degrees C (59 and 86 degrees F). Throw away any unused medicine after the expiration date. NOTE: This sheet is a summary. It may not cover all possible information. If you have questions about this medicine, talk to your doctor, pharmacist, or health care provider.    2016, Elsevier/Gold Standard. (2015-02-04 13:07:35) Donepezil tablets What is this medicine? DONEPEZIL (doe NEP e zil) is used to treat mild to moderate dementia caused by Alzheimer's disease. This medicine may be used for other purposes; ask your health care provider or pharmacist if you have questions. What should I tell my health care provider before I take this medicine? They need to know if you have any of these conditions: -asthma or other lung disease -difficulty passing urine -head injury -heart disease -history of irregular heartbeat -liver disease -seizures (convulsions) -stomach or intestinal disease, ulcers or stomach bleeding -an unusual or allergic reaction to donepezil, other medicines, foods, dyes, or preservatives -pregnant or trying to get pregnant -breast-feeding How should I use this medicine? Take this medicine by mouth with a glass of water. Follow the directions on the prescription label. You may take this medicine with or without food. Take this medicine at regular intervals. This medicine is  usually taken before bedtime. Do not take it more often than directed.  Continue to take your medicine even if you feel better. Do not stop taking except on your doctor's advice. If you are taking the 23 mg donepezil tablet, swallow it whole; do not cut, crush, or chew it. Talk to your pediatrician regarding the use of this medicine in children. Special care may be needed. Overdosage: If you think you have taken too much of this medicine contact a poison control center or emergency room at once. NOTE: This medicine is only for you. Do not share this medicine with others. What if I miss a dose? If you miss a dose, take it as soon as you can. If it is almost time for your next dose, take only that dose, do not take double or extra doses. What may interact with this medicine? Do not take this medicine with any of the following medications: -certain medicines for fungal infections like itraconazole, fluconazole, posaconazole, and voriconazole -cisapride -dextromethorphan; quinidine -dofetilide -dronedarone -pimozide -quinidine -thioridazine -ziprasidone This medicine may also interact with the following medications: -antihistamines for allergy, cough and cold -atropine -bethanechol -carbamazepine -certain medicines for bladder problems like oxybutynin, tolterodine -certain medicines for Parkinson's disease like benztropine, trihexyphenidyl -certain medicines for stomach problems like dicyclomine, hyoscyamine -certain medicines for travel sickness like scopolamine -dexamethasone -ipratropium -NSAIDs, medicines for pain and inflammation, like ibuprofen or naproxen -other medicines for Alzheimer's disease -other medicines that prolong the QT interval (cause an abnormal heart rhythm) -phenobarbital -phenytoin -rifampin, rifabutin or rifapentine This list may not describe all possible interactions. Give your health care provider a list of all the medicines, herbs, non-prescription drugs, or dietary supplements you use. Also tell them if you smoke, drink  alcohol, or use illegal drugs. Some items may interact with your medicine. What should I watch for while using this medicine? Visit your doctor or health care professional for regular checks on your progress. Check with your doctor or health care professional if your symptoms do not get better or if they get worse. You may get drowsy or dizzy. Do not drive, use machinery, or do anything that needs mental alertness until you know how this drug affects you. What side effects may I notice from receiving this medicine? Side effects that you should report to your doctor or health care professional as soon as possible: -allergic reactions like skin rash, itching or hives, swelling of the face, lips, or tongue -changes in vision -feeling faint or lightheaded, falls -problems with balance -redness, blistering, peeling or loosening of the skin, including inside the mouth -slow heartbeat, or palpitations -stomach pain -unusual bleeding or bruising, red or purple spots on the skin -vomiting -weight loss Side effects that usually do not require medical attention (report to your doctor or health care professional if they continue or are bothersome): -diarrhea, especially when starting treatment -headache -indigestion or heartburn -loss of appetite -muscle cramps -nausea This list may not describe all possible side effects. Call your doctor for medical advice about side effects. You may report side effects to FDA at 1-800-FDA-1088. Where should I keep my medicine? Keep out of reach of children. Store at room temperature between 15 and 30 degrees C (59 and 86 degrees F). Throw away any unused medicine after the expiration date. NOTE: This sheet is a summary. It may not cover all possible information. If you have questions about this medicine, talk to your doctor, pharmacist, or health care provider.    2016, Elsevier/Gold Standard. (2014-03-14  07:51:52)  

## 2015-06-18 ENCOUNTER — Telehealth: Payer: Self-pay

## 2015-06-18 NOTE — Telephone Encounter (Signed)
Advised pt that her labs were normal, except that her platelets were low. Pt knows her platelets are low, and said that 124 is on the increase. I advised pt that vit b12 and MRI are still pending but we will call her for those results. Pt verbalized understanding.

## 2015-06-18 NOTE — Telephone Encounter (Signed)
-----   Message from Larey Seat, MD sent at 06/18/2015  2:55 PM EDT ----- [name] platelet count has again been low but has been this way for over a year. Normal white blood cell count no evidence of anemia. Thyroid-stimulating hormone is now in normal levels. Vitamin B12 is still pending MRI of the brain is ordered. Please call.   FYI to PCP , CD

## 2015-06-19 LAB — CBC WITH DIFFERENTIAL/PLATELET
Basophils Absolute: 0.1 10*3/uL (ref 0.0–0.2)
Basos: 1 %
EOS (ABSOLUTE): 0.1 10*3/uL (ref 0.0–0.4)
Eos: 2 %
Hematocrit: 42.1 % (ref 34.0–46.6)
Hemoglobin: 13.9 g/dL (ref 11.1–15.9)
IMMATURE GRANULOCYTES: 0 %
Immature Grans (Abs): 0 10*3/uL (ref 0.0–0.1)
LYMPHS: 30 %
Lymphocytes Absolute: 1.6 10*3/uL (ref 0.7–3.1)
MCH: 26.8 pg (ref 26.6–33.0)
MCHC: 33 g/dL (ref 31.5–35.7)
MCV: 81 fL (ref 79–97)
MONOCYTES: 5 %
Monocytes Absolute: 0.3 10*3/uL (ref 0.1–0.9)
NEUTROS ABS: 3.4 10*3/uL (ref 1.4–7.0)
Neutrophils: 62 %
Platelets: 124 10*3/uL — ABNORMAL LOW (ref 150–379)
RBC: 5.18 x10E6/uL (ref 3.77–5.28)
RDW: 14.2 % (ref 12.3–15.4)
WBC: 5.5 10*3/uL (ref 3.4–10.8)

## 2015-06-19 LAB — METHYLMALONIC ACID, SERUM: Methylmalonic Acid: 238 nmol/L (ref 0–378)

## 2015-06-19 LAB — TSH: TSH: 1.21 u[IU]/mL (ref 0.450–4.500)

## 2015-06-20 LAB — COMPREHENSIVE METABOLIC PANEL
ALBUMIN: 4.6 g/dL (ref 3.5–4.8)
ALK PHOS: 122 IU/L — AB (ref 39–117)
ALT: 14 IU/L (ref 0–32)
AST: 19 IU/L (ref 0–40)
Albumin/Globulin Ratio: 2.3 (ref 1.1–2.5)
BUN/Creatinine Ratio: 14 (ref 11–26)
BUN: 14 mg/dL (ref 8–27)
Bilirubin Total: 0.3 mg/dL (ref 0.0–1.2)
CHLORIDE: 99 mmol/L (ref 97–106)
CO2: 22 mmol/L (ref 18–29)
Calcium: 9.6 mg/dL (ref 8.7–10.3)
Creatinine, Ser: 1.01 mg/dL — ABNORMAL HIGH (ref 0.57–1.00)
GFR calc Af Amer: 62 mL/min/{1.73_m2} (ref >59)
GFR calc non Af Amer: 53 mL/min/{1.73_m2} — ABNORMAL LOW (ref >59)
GLUCOSE: 98 mg/dL (ref 65–99)
Globulin, Total: 2 g/dL (ref 1.5–4.5)
Potassium: 4.2 mmol/L (ref 3.5–5.2)
Sodium: 141 mmol/L (ref 136–144)
Total Protein: 6.6 g/dL (ref 6.0–8.5)

## 2015-06-23 ENCOUNTER — Telehealth: Payer: Self-pay

## 2015-06-23 NOTE — Telephone Encounter (Signed)
-----   Message from Larey Seat, MD sent at 06/23/2015  5:13 PM EST ----- Elevated creatinine, this has been noted in previous tests, over the last 12 month.  Slightly reduced platelets.  Normal thyroid and B 12.

## 2015-06-23 NOTE — Telephone Encounter (Signed)
Advised pt that her creatinine was slightly elevated, but this has been noted before. I advised her that her thyroid and b 12 were normal. We discussed her platelets last time. Pt verbalized understanding.

## 2015-07-06 ENCOUNTER — Ambulatory Visit
Admission: RE | Admit: 2015-07-06 | Discharge: 2015-07-06 | Disposition: A | Discharge status: Home / Self Care | Payer: Medicare Other | Source: Ambulatory Visit | Attending: Neurology | Admitting: Neurology

## 2015-07-06 DIAGNOSIS — H538 Other visual disturbances: Secondary | ICD-10-CM | POA: Diagnosis not present

## 2015-07-06 DIAGNOSIS — R41842 Visuospatial deficit: Secondary | ICD-10-CM

## 2015-07-06 DIAGNOSIS — F0390 Unspecified dementia without behavioral disturbance: Secondary | ICD-10-CM

## 2015-07-06 DIAGNOSIS — F068 Other specified mental disorders due to known physiological condition: Secondary | ICD-10-CM | POA: Diagnosis not present

## 2015-07-06 DIAGNOSIS — F22 Delusional disorders: Secondary | ICD-10-CM

## 2015-07-07 ENCOUNTER — Telehealth: Payer: Self-pay | Admitting: Neurology

## 2015-07-07 NOTE — Telephone Encounter (Signed)
Spoke to pt and advised her that her MRI brain showed mild progression of her small vessel disease, but nothing acute. Pt verbalized understanding. Pt wants to discuss this in further detail with Hoyle Sauer, NP at her appt next week.

## 2015-07-07 NOTE — Telephone Encounter (Signed)
Please call patient, MRI of the brain showed evidence of small vessel disease, mild progression compare to previous scan in 2007  IMPRESSION: This MRI of the brain without contrast shows the following: 1. Moderate extent of T2/FLAIR hyperintense foci in the cerebral hemispheres, predominantly in the deep and subcortical white matter. This is a nonspecific finding and likely represents chronic microvascular ischemic change. Demyelination would be less likely to give this pattern. When compared to the MRI dated 01/17/2006, there has been progression in the number of these foci. 2. Brain volume is normal for age. 3. There are no acute findings.

## 2015-07-15 ENCOUNTER — Encounter (INDEPENDENT_AMBULATORY_CARE_PROVIDER_SITE_OTHER): Payer: Self-pay

## 2015-07-15 ENCOUNTER — Encounter: Payer: Self-pay | Admitting: Nurse Practitioner

## 2015-07-15 ENCOUNTER — Ambulatory Visit (INDEPENDENT_AMBULATORY_CARE_PROVIDER_SITE_OTHER): Payer: Medicare Other | Admitting: Nurse Practitioner

## 2015-07-15 VITALS — BP 152/76 | HR 78 | Ht 61.5 in | Wt 154.8 lb

## 2015-07-15 DIAGNOSIS — R413 Other amnesia: Secondary | ICD-10-CM | POA: Diagnosis not present

## 2015-07-15 DIAGNOSIS — F6811 Factitious disorder with predominantly psychological signs and symptoms: Secondary | ICD-10-CM

## 2015-07-15 DIAGNOSIS — F22 Delusional disorders: Secondary | ICD-10-CM

## 2015-07-15 MED ORDER — DONEPEZIL HCL 10 MG PO TABS: 10.0000 mg | ORAL_TABLET | Freq: Every day | ORAL | Status: DC

## 2015-07-15 NOTE — Patient Instructions (Signed)
Increase Aricept to 10mg  daily will refill May stop Seroquel without titrating. Follow up in 6 months for memory testing

## 2015-07-15 NOTE — Progress Notes (Signed)
[facility]  PATIENT: Brittney [name] DOB: [birth year]   REASON FOR VISIT:  Follow-up for memory loss, pseudo parasitic sensory abnormalities HISTORY FROM:patient and husband    HISTORY OF PRESENT ILLNESS: 06/17/15 CDMariola Sy [name] is a 80 y.o. female , seen here as a referral/ revisit from Dr. Wilhemina Bonito for pseudoparasitic sensory abnormalities, psychosis.  Chief complaint according to patient : " The patient feels that small lesions are developing all over her skin including at the root of the eyelashes and upper lip"  She reports a sensation of movement under her skin. She does not feel that these are nocturnal or daytime active sensations but that they occur any time and that there perpetually there. She has tried with Q-tips to manipulate to get to the bottom of the skin lesions but has actually created some skin lesions in the process.  She had seen Dr. Wilhemina Bonito at [facility] dermatology and endorsed symptoms of skin irritation little, bumps -changing in size, Itchiness,tenderness, sometimes soreness but also a creepy crawly sensation under the skin. This does not affect one region of the body alone but includes arms lower extremities trunk and face. His causes the patient discomfort and also has affected her sleep and actually consumes a significant time of her thoughts and daytime. Her husband agrees.  In addition Dr. Ronnald Ramp eluded to some cognitive difficulties that have developed. He was concerned about the beginning memory loss disorder. [name] underwent 2 tests here today a Mini-Mental Status examination as well as a Montral cognitive assessment test. On the Mini-Mental Status Examination she missed 5 out of 5 points on attention and calculation and could not recall any of the 3 recall words.. I would have given her 22 out of 30 points for this performance. The Montral cognitive assessment test is much more difficult she was not able to solve the  trail making test or draw a three-dimensional cube. She placed the contour and numbers into a clock face but not the hands. She was able to name 3 out of 3 animals was able to repeat a series of digits but had trouble with his serial 7 again. She was able to abstract, her word fluency was good with 11 F words, and she scored 6 out of 6 points on orientation date and place. This is similar to her Mini-Mental Status Examination.  It is unusual for patient to be fully oriented to date place and time and have primarily visual spatial difficulties and recall difficulties. It is clear visit short-term memory is affected but thus it should affect date and time as well. The patient is aware of her cognitive deficits and day-to-day life. The couple recently stayed in a hotel and the patient was not sure which direction to go to the elevator for example. She has not gotten lost outside. The patient prepares about 1 warm meal per week, she states she now wears gloves and a Cap , is afraid to contaminate the food. She has never balanced the chequebook, she only worked 10 years of her life outside the home. She worked for [employer].   The patient is the oldest of 5 siblings and as far she knows not no other sibling has difficulties with cognition or memory. Her parents were not affected by dementia. She has 1 maternal first cousin who suffers from Alzheimer's disease.  UPDATE11/29/16 [name], 80 year old female returns for follow-up. She was initially evaluated on 06/17/2015 by Dr. Brett Fairy for memory loss and pseudo-parasitic  sensory abnormalities. She continues to report a sensation of movement under her skin at times. She feels that her memory is better .  MRI of the brain performed 07/06/2015 without acute findings but nonspecific chronic microvascular ischemic changes. Thyroid and B12 levels were normal She was placed on Seroquel and Aricept. She returns for follow-up  REVIEW OF SYSTEMS: Full 14 system review of  systems performed and notable only for those listed, all others are neg:  Constitutional: neg  Cardiovascular: neg Ear/Nose/Throat: neg  Skin: Itching Eyes: neg Respiratory: neg Gastroitestinal: Constipation  Genitourinary: Urinary frequency Hematology/Lymphatic: neg  Endocrine: neg Musculoskeletal:neg Allergy/Immunology: neg Neurological: Memory loss Psychiatric: neg Sleep : neg   ALLERGIES: Allergies  Allergen Reactions  . Amitriptyline Other (See Comments)    Skewed vision  . Latex Other (See Comments)    Blisters skin  . Lipitor [Atorvastatin] Other (See Comments)    myalgias  . Meperidine Other (See Comments)    Mental status changes  . Motrin [Ibuprofen] Swelling  . Rosuvastatin Other (See Comments)    Joint and muscle pain  . Sulfa Antibiotics Other (See Comments)    Reaction unknown  . Vioxx [Rofecoxib] Other (See Comments)    Reaction unknown  . Augmentin [Amoxicillin-Pot Clavulanate] Rash and Other (See Comments)    HOME MEDICATIONS: Outpatient Prescriptions Prior to Visit  Medication Sig Dispense Refill  . bimatoprost (LUMIGAN) 0.03 % ophthalmic solution Place 1 drop into both eyes at bedtime.    . butalbital-acetaminophen-caffeine (FIORICET WITH CODEINE) 50-325-40-30 MG per capsule Take 1 capsule by mouth every 4 (four) hours as needed for headache.    . cyclobenzaprine (FLEXERIL) 5 MG tablet Take 5 mg by mouth 3 (three) times daily as needed for muscle spasms.    [name] donepezil (ARICEPT) 5 MG tablet Take 1 tablet (5 mg total) by mouth at bedtime. 30 tablet 0  . ezetimibe (ZETIA) 10 MG tablet Take 10 mg by mouth daily.    . fluticasone (FLONASE) 50 MCG/ACT nasal spray Place 2 sprays into both nostrils daily as needed for rhinitis.     [name] gabapentin (NEURONTIN) 100 MG capsule Take 100 mg by mouth every other day.    . levothyroxine (SYNTHROID, LEVOTHROID) 75 MCG tablet Take 75 mcg by mouth daily before breakfast.    . meclizine (ANTIVERT) 12.5 MG tablet Take  12.5 mg by mouth 3 (three) times daily as needed for dizziness.    [name] QUEtiapine (SEROQUEL) 25 MG tablet Take 1 tablet (25 mg total) by mouth at bedtime. 30 tablet 2  . timolol (BETIMOL) 0.25 % ophthalmic solution Place 1 drop into both eyes daily.     . timolol (TIMOPTIC) 0.25 % ophthalmic solution Apply to eye.     No facility-administered medications prior to visit.    PAST MEDICAL HISTORY: Past Medical History  Diagnosis Date  . Familial hypercholesterolemia   . Hypothyroid   . Glaucoma   . GERD (gastroesophageal reflux disease)   . Post herpetic neuralgia   . Migraine headache   . CKD (chronic kidney disease), stage III     PAST SURGICAL HISTORY: Past Surgical History  Procedure Laterality Date  . Appendectomy    . Tubal ligation    . Cholecystectomy    . Vaginal hysterectomy    . Anal fissure repair    . Hemithyroidectomy    . Incontinence surgery    . Vaginal vault repair    . Cataract surgery      left  . Basal cell  carcinoma excision    . Thyroidectomy      FAMILY HISTORY: Family History  Problem Relation Age of Onset  . Heart disease Mother     chf  . Heart disease Sister     CABG    SOCIAL HISTORY: Social History   Social History  . Marital Status: Married    Spouse Name: N/A  . Number of Children: 4  . Years of Education: N/A   Occupational History  . Not on file.   Social History Main Topics  . Smoking status: Never Smoker   . Smokeless tobacco: Not on file  . Alcohol Use: Not on file  . Drug Use: Not on file  . Sexual Activity: Not on file   Other Topics Concern  . Not on file   Social History Narrative     PHYSICAL EXAM  Filed Vitals:   07/15/15 1028  BP: 152/76  Pulse: 78  Height: 5' 1.5" (1.562 m)  Weight: 154 lb 12.8 oz (70.217 kg)   Body mass index is 28.78 kg/(m^2).  Generalized: Well developed, in no acute distress, well groomed  Head: normocephalic and atraumatic,. Oropharynx benign  Neck: Supple, no carotid  bruits  Cardiac: Regular rate rhythm, no murmur  Musculoskeletal: No deformity   Neurological examination   Mentation: Alert oriented to time, place, history taking. Attention span and concentration appropriate. Recent and remote memory intact.  Follows all commands speech and language fluent. MOCA 24/30 Last 18/30.   Cranial nerve II-XII: Fundoscopic exam reveals sharp disc margins.Pupils were equal round reactive to light extraocular movements were full, visual field were full on confrontational test. Facial sensation and strength were normal. hearing impaired has hearing aids. Uvula tongue midline. head turning and shoulder shrug were normal and symmetric.Tongue protrusion into cheek strength was normal. Motor: normal bulk and tone, full strength in the BUE, BLE, fine finger movements normal, no pronator drift. No focal weakness Sensory: normal and symmetric to light touch, pinprick, and  Vibration, proprioception  Coordination: finger-nose-finger, heel-to-shin bilaterally, no dysmetria Reflexes: Brachioradialis 2/2, biceps 2/2, triceps 2/2, patellar 2/2, Achilles 2/2, plantar responses were flexor bilaterally. Gait and Station: Rising up from seated position without assistance, normal stance,  moderate stride, good arm swing, smooth turning, able to perform tiptoe, and heel walking without difficulty. Tandem gait is steady  DIAGNOSTIC DATA (LABS, IMAGING, TESTING) - I reviewed patient records, labs, notes, testing and imaging myself where available.  Lab Results  Component Value Date   WBC 5.5 06/17/2015   HGB 13.4 04/30/2015   HCT 42.1 06/17/2015   MCV 81.7 04/30/2015   PLT * 04/30/2015    119 Platelet count consistent in citrate, large plts, Occ giant plt      Component Value Date/Time   NA 141 06/17/2015 1147   NA 142 04/30/2015 1503   NA 140 05/27/2014 0522   K 4.2 06/17/2015 1147   K 4.0 04/30/2015 1503   CL 99 06/17/2015 1147   CO2 22 06/17/2015 1147   CO2 31* 04/30/2015  1503   GLUCOSE 98 06/17/2015 1147   GLUCOSE 100 04/30/2015 1503   GLUCOSE 92 05/27/2014 0522   BUN 14 06/17/2015 1147   BUN 16.5 04/30/2015 1503   BUN 10 05/27/2014 0522   CREATININE 1.01* 06/17/2015 1147   CREATININE 1.2* 04/30/2015 1503   CALCIUM 9.6 06/17/2015 1147   CALCIUM 9.4 04/30/2015 1503   PROT 6.6 06/17/2015 1147   PROT 6.4 04/30/2015 1503   PROT 5.9* 05/26/2014 0535  ALBUMIN 4.6 06/17/2015 1147   ALBUMIN 3.8 04/30/2015 1503   ALBUMIN 3.1* 05/26/2014 0535   AST 19 06/17/2015 1147   AST 16 04/30/2015 1503   ALT 14 06/17/2015 1147   ALT 11 04/30/2015 1503   ALKPHOS 122* 06/17/2015 1147   ALKPHOS 115 04/30/2015 1503   BILITOT 0.3 06/17/2015 1147   BILITOT 0.20 04/30/2015 1503   BILITOT 0.3 05/26/2014 0535   GFRNONAA 53* 06/17/2015 1147   GFRAA 62 06/17/2015 1147    Lab Results  Component Value Date   TSH 1.210 06/17/2015      ASSESSMENT AND PLAN  80 y.o. year old female  has a past medical history of memory loss and pseudo parasitic  Dysesthesias.The MRI of the brain without contrast shows the following: 1. Moderate extent of T2/FLAIR hyperintense foci in the cerebral hemispheres, predominantly in the deep and subcortical white matter. This is a nonspecific finding and likely represents chronic microvascular ischemic change. Demyelination would be less likely to give this pattern. When compared to the MRI dated 01/17/2006, there has been progression in the number of these foci. 2. Brain volume is normal for age. 3. There are no acute findings. This was reviewed with the patient  Increase Aricept to 10mg  daily for memory disorder will refill May stop Seroquel this was ordered for the pseudo-parasitic dysesthesias which has helped, patient is not sure if she will stay on the med  or not after reading the side effects  Follow up in 6 months for memory testingVst time 25 min Dennie Bible, [facility] [place], [facility], [facility] [address], [place], [place] [address] [phone]

## 2015-07-15 NOTE — Progress Notes (Signed)
I agree with the assessment and plan as directed by NP .The patient is known to me .   Marium Ragan, MD  

## 2015-08-12 DIAGNOSIS — T300 Burn of unspecified body region, unspecified degree: Secondary | ICD-10-CM | POA: Diagnosis not present

## 2015-08-12 DIAGNOSIS — R300 Dysuria: Secondary | ICD-10-CM | POA: Diagnosis not present

## 2015-08-12 DIAGNOSIS — J320 Chronic maxillary sinusitis: Secondary | ICD-10-CM | POA: Diagnosis not present

## 2015-09-01 DIAGNOSIS — J209 Acute bronchitis, unspecified: Secondary | ICD-10-CM | POA: Diagnosis not present

## 2015-09-01 DIAGNOSIS — M79605 Pain in left leg: Secondary | ICD-10-CM | POA: Diagnosis not present

## 2015-11-05 DIAGNOSIS — H401131 Primary open-angle glaucoma, bilateral, mild stage: Secondary | ICD-10-CM | POA: Diagnosis not present

## 2016-01-15 ENCOUNTER — Encounter: Payer: Self-pay | Admitting: Nurse Practitioner

## 2016-01-15 ENCOUNTER — Ambulatory Visit (INDEPENDENT_AMBULATORY_CARE_PROVIDER_SITE_OTHER): Payer: Medicare Other | Admitting: Nurse Practitioner

## 2016-01-15 VITALS — BP 130/82 | HR 58 | Wt 161.0 lb

## 2016-01-15 DIAGNOSIS — F22 Delusional disorders: Secondary | ICD-10-CM

## 2016-01-15 DIAGNOSIS — R413 Other amnesia: Secondary | ICD-10-CM | POA: Diagnosis not present

## 2016-01-15 DIAGNOSIS — F6811 Factitious disorder with predominantly psychological signs and symptoms: Secondary | ICD-10-CM | POA: Diagnosis not present

## 2016-01-15 DIAGNOSIS — G43809 Other migraine, not intractable, without status migrainosus: Secondary | ICD-10-CM | POA: Diagnosis not present

## 2016-01-15 MED ORDER — DONEPEZIL HCL 10 MG PO TABS: 10.0000 mg | ORAL_TABLET | Freq: Every day | ORAL | Status: DC

## 2016-01-15 NOTE — Progress Notes (Signed)
[facility]  PATIENT: Brittney [name] DOB: [birth year]   REASON FOR VISIT: Follow-up for dementia, pseudo parasitic dysesthesias HISTORY FROM: Patient and husband    HISTORY OF PRESENT ILLNESS: 06/17/15 Brittney [name] is a 81 y.o. female , seen here as a referral/ revisit from Dr. Wilhemina Bonito for pseudoparasitic sensory abnormalities, psychosis.  Chief complaint according to patient : " The patient feels that small lesions are developing all over her skin including at the root of the eyelashes and upper lip"  She reports a sensation of movement under her skin. She does not feel that these are nocturnal or daytime active sensations but that they occur any time and that there perpetually there. She has tried with Q-tips to manipulate to get to the bottom of the skin lesions but has actually created some skin lesions in the process.  She had seen Dr. Wilhemina Bonito at [facility] dermatology and endorsed symptoms of skin irritation little, bumps -changing in size, Itchiness,tenderness, sometimes soreness but also a creepy crawly sensation under the skin. This does not affect one region of the body alone but includes arms lower extremities trunk and face. His causes the patient discomfort and also has affected her sleep and actually consumes a significant time of her thoughts and daytime. Her husband agrees.  In addition Dr. Ronnald Ramp eluded to some cognitive difficulties that have developed. He was concerned about the beginning memory loss disorder. Brittney [name] underwent 2 tests here today a Mini-Mental Status examination as well as a Montral cognitive assessment test. On the Mini-Mental Status Examination she missed 5 out of 5 points on attention and calculation and could not recall any of the 3 recall words.. I would have given her 22 out of 30 points for this performance. The Montral cognitive assessment test is much more difficult she was not able to solve the trail  making test or draw a three-dimensional cube. She placed the contour and numbers into a clock face but not the hands. She was able to name 3 out of 3 animals was able to repeat a series of digits but had trouble with his serial 7 again. She was able to abstract, her word fluency was good with 11 F words, and she scored 6 out of 6 points on orientation date and place. This is similar to her Mini-Mental Status Examination.  It is unusual for patient to be fully oriented to date place and time and have primarily visual spatial difficulties and recall difficulties. It is clear visit short-term memory is affected but thus it should affect date and time as well. The patient is aware of her cognitive deficits and day-to-day life. The couple recently stayed in a hotel and the patient was not sure which direction to go to the elevator for example. She has not gotten lost outside. The patient prepares about 1 warm meal per week, she states she now wears gloves and a Cap , is afraid to contaminate the food. She has never balanced the chequebook, she only worked 10 years of her life outside the home. She worked for [employer].   The patient is the oldest of 5 siblings and as far she knows not no other sibling has difficulties with cognition or memory. Her parents were not affected by dementia. She has 1 maternal first cousin who suffers from Alzheimer's disease.  UPDATE11/29/16 Brittney [name], 81 year old female returns for follow-up. She was initially evaluated on 06/17/2015 by Dr. Brett Fairy for memory loss and pseudo-parasitic sensory abnormalities.  She continues to report a sensation of movement under her skin at times. She feels that her memory is better . MRI of the brain performed 07/06/2015 without acute findings but nonspecific chronic microvascular ischemic changes. Thyroid and B12 levels were normal She was placed on Seroquel and Aricept. She returns for follow-up UPDATE 06/01/2017CM Brittney [name], 81 year old female  returns for follow-up. She has a history of memory loss and is currently on Aricept 10 mg at bedtime however she reports vivid dreaming and she was asked to take in the morning. She continues to have occasional migraine is followed by her primary care and she takes Fioricet. Her memory score stable today. She tries to stay active. She returns for reevaluation with her husband REVIEW OF SYSTEMS: Full 14 system review of systems performed and notable only for those listed, all others are neg:  Constitutional: Fatigue  Cardiovascular: neg Ear/Nose/Throat: neg  Skin: neg Eyes: neg Respiratory: neg Gastroitestinal: neg  Hematology/Lymphatic: neg  Endocrine: neg Musculoskeletal: Joint pain Allergy/Immunology: neg Neurological: History of headaches, memory loss Psychiatric: neg Sleep : neg   ALLERGIES: Allergies  Allergen Reactions  . Amitriptyline Other (See Comments)    Skewed vision  . Latex Other (See Comments)    Blisters skin  . Lipitor [Atorvastatin] Other (See Comments)    myalgias  . Meperidine Other (See Comments)    Mental status changes  . Motrin [Ibuprofen] Swelling  . Rosuvastatin Other (See Comments)    Joint and muscle pain  . Sulfa Antibiotics Other (See Comments)    Reaction unknown  . Vioxx [Rofecoxib] Other (See Comments)    Reaction unknown  . Augmentin [Amoxicillin-Pot Clavulanate] Rash and Other (See Comments)    HOME MEDICATIONS: Outpatient Prescriptions Prior to Visit  Medication Sig Dispense Refill  . bimatoprost (LUMIGAN) 0.03 % ophthalmic solution Place 1 drop into both eyes at bedtime.    . butalbital-acetaminophen-caffeine (FIORICET WITH CODEINE) 50-325-40-30 MG per capsule Take 1 capsule by mouth every 4 (four) hours as needed for headache.    . cyclobenzaprine (FLEXERIL) 5 MG tablet Take 5 mg by mouth 3 (three) times daily as needed for muscle spasms.    [name] donepezil (ARICEPT) 10 MG tablet Take 1 tablet (10 mg total) by mouth at bedtime. 30 tablet  6  . ezetimibe (ZETIA) 10 MG tablet Take 10 mg by mouth daily.    . fluticasone (FLONASE) 50 MCG/ACT nasal spray Place 2 sprays into both nostrils daily as needed for rhinitis.     [name] gabapentin (NEURONTIN) 100 MG capsule Take 100 mg by mouth every other day.    . levothyroxine (SYNTHROID, LEVOTHROID) 75 MCG tablet Take 75 mcg by mouth daily before breakfast.    . meclizine (ANTIVERT) 12.5 MG tablet Take 12.5 mg by mouth 3 (three) times daily as needed for dizziness.    [name] QUEtiapine (SEROQUEL) 25 MG tablet Take 1 tablet (25 mg total) by mouth at bedtime. 30 tablet 2  . timolol (BETIMOL) 0.25 % ophthalmic solution Place 1 drop into both eyes daily.      No facility-administered medications prior to visit.    PAST MEDICAL HISTORY: Past Medical History  Diagnosis Date  . Familial hypercholesterolemia   . Hypothyroid   . Glaucoma   . GERD (gastroesophageal reflux disease)   . Post herpetic neuralgia   . Migraine headache   . CKD (chronic kidney disease), stage III     PAST SURGICAL HISTORY: Past Surgical History  Procedure Laterality Date  . Appendectomy    .  Tubal ligation    . Cholecystectomy    . Vaginal hysterectomy    . Anal fissure repair    . Hemithyroidectomy    . Incontinence surgery    . Vaginal vault repair    . Cataract surgery      left  . Basal cell carcinoma excision    . Thyroidectomy      FAMILY HISTORY: Family History  Problem Relation Age of Onset  . Heart disease Mother     chf  . Heart disease Sister     CABG    SOCIAL HISTORY: Social History   Social History  . Marital Status: Married    Spouse Name: Nadara Mustard  . Number of Children: 4  . Years of Education: 12   Occupational History  . Not on file.   Social History Main Topics  . Smoking status: Never Smoker   . Smokeless tobacco: Not on file  . Alcohol Use: Not on file  . Drug Use: Not on file  . Sexual Activity: Not on file   Other Topics Concern  . Not on file   Social History  Narrative     PHYSICAL EXAM  Filed Vitals:   01/15/16 0957  BP: 171/76  Pulse: 58  Weight: 161 lb (73.029 kg)   Body mass index is 29.93 kg/(m^2). Generalized: Well developed, in no acute distress, well groomed  Head: normocephalic and atraumatic,. Oropharynx benign  Neck: Supple, no carotid bruits  Cardiac: Regular rate rhythm, no murmur  Musculoskeletal: No deformity   Neurological examination   Mentation: Alert oriented to time, place, history taking. Attention span and concentration appropriate. Recent and remote memory intact. Follows all commands speech and language fluent. MMSE 29/30.  Cranial nerve II-XII: Pupils were equal round reactive to light extraocular movements were full, visual field were full on confrontational test. Facial sensation and strength were normal. hearing impaired has hearing aids. Uvula tongue midline. head turning and shoulder shrug were normal and symmetric.Tongue protrusion into cheek strength was normal. Motor: normal bulk and tone, full strength in the BUE, BLE, fine finger movements normal, no pronator drift. No focal weakness Sensory: normal and symmetric to light touch, pinprick, and Vibration,  Coordination: finger-nose-finger, heel-to-shin bilaterally, no dysmetria Reflexes: Brachioradialis 2/2, biceps 2/2, triceps 2/2, patellar 2/2, Achilles 2/2, plantar responses were flexor bilaterally. Gait and Station: Rising up from seated position without assistance, normal stance, moderate stride, good arm swing, smooth turning, able to perform tiptoe, and heel walking without difficulty. Tandem gait is mildly unsteady  DIAGNOSTIC DATA (LABS, IMAGING, TESTING)   Lab Results  Component Value Date   WBC 5.5 06/17/2015   HGB 13.4 04/30/2015   HCT 42.1 06/17/2015   MCV 81 06/17/2015   PLT 124* 06/17/2015      Component Value Date/Time   NA 141 06/17/2015 1147   NA 142 04/30/2015 1503   NA 140 05/27/2014 0522   K 4.2 06/17/2015 1147   K  4.0 04/30/2015 1503   CL 99 06/17/2015 1147   CO2 22 06/17/2015 1147   CO2 31* 04/30/2015 1503   GLUCOSE 98 06/17/2015 1147   GLUCOSE 100 04/30/2015 1503   GLUCOSE 92 05/27/2014 0522   BUN 14 06/17/2015 1147   BUN 16.5 04/30/2015 1503   BUN 10 05/27/2014 0522   CREATININE 1.01* 06/17/2015 1147   CREATININE 1.2* 04/30/2015 1503   CALCIUM 9.6 06/17/2015 1147   CALCIUM 9.4 04/30/2015 1503   PROT 6.6 06/17/2015 1147   PROT 6.4 04/30/2015  1503   PROT 5.9* 05/26/2014 0535   ALBUMIN 4.6 06/17/2015 1147   ALBUMIN 3.8 04/30/2015 1503   ALBUMIN 3.1* 05/26/2014 0535   AST 19 06/17/2015 1147   AST 16 04/30/2015 1503   ALT 14 06/17/2015 1147   ALT 11 04/30/2015 1503   ALKPHOS 122* 06/17/2015 1147   ALKPHOS 115 04/30/2015 1503   BILITOT 0.3 06/17/2015 1147   BILITOT 0.20 04/30/2015 1503   BILITOT 0.3 05/26/2014 0535   GFRNONAA 53* 06/17/2015 1147   GFRAA 62 06/17/2015 1147       ASSESSMENT AND PLAN  81 y.o. year old female  has a past medical history of memory loss and pseudo parasitic dysthesias.   Continue Aricept at 10 mg will refill for 6 months Memory score is stable Follow-up in 6 months next visit with Dr. De Nurse, [facility], [facility], APRN  [facility] [address] [place], [place] [address] [phone] An

## 2016-01-15 NOTE — Patient Instructions (Signed)
Continue Aricept at 10 mg will refill for 6 months Memory score is stable Follow-up in 6 months next visit with Dr. Brett Fairy

## 2016-01-15 NOTE — Progress Notes (Signed)
I agree with the assessment and plan as directed by NP .The patient is known to me .   Linken Mcglothen, MD  

## 2016-01-22 DIAGNOSIS — Z780 Asymptomatic menopausal state: Secondary | ICD-10-CM | POA: Diagnosis not present

## 2016-01-23 DIAGNOSIS — R404 Transient alteration of awareness: Secondary | ICD-10-CM | POA: Diagnosis not present

## 2016-01-23 DIAGNOSIS — R42 Dizziness and giddiness: Secondary | ICD-10-CM | POA: Diagnosis not present

## 2016-02-11 DIAGNOSIS — Z1231 Encounter for screening mammogram for malignant neoplasm of breast: Secondary | ICD-10-CM | POA: Diagnosis not present

## 2016-02-11 DIAGNOSIS — Z803 Family history of malignant neoplasm of breast: Secondary | ICD-10-CM | POA: Diagnosis not present

## 2016-03-15 DIAGNOSIS — Z0000 Encounter for general adult medical examination without abnormal findings: Secondary | ICD-10-CM | POA: Diagnosis not present

## 2016-03-15 DIAGNOSIS — E559 Vitamin D deficiency, unspecified: Secondary | ICD-10-CM | POA: Diagnosis not present

## 2016-03-15 DIAGNOSIS — R1084 Generalized abdominal pain: Secondary | ICD-10-CM | POA: Diagnosis not present

## 2016-03-15 DIAGNOSIS — N183 Chronic kidney disease, stage 3 (moderate): Secondary | ICD-10-CM | POA: Diagnosis not present

## 2016-03-15 DIAGNOSIS — D696 Thrombocytopenia, unspecified: Secondary | ICD-10-CM | POA: Diagnosis not present

## 2016-03-15 DIAGNOSIS — E039 Hypothyroidism, unspecified: Secondary | ICD-10-CM | POA: Diagnosis not present

## 2016-03-15 DIAGNOSIS — K219 Gastro-esophageal reflux disease without esophagitis: Secondary | ICD-10-CM | POA: Diagnosis not present

## 2016-03-15 DIAGNOSIS — R413 Other amnesia: Secondary | ICD-10-CM | POA: Diagnosis not present

## 2016-03-15 DIAGNOSIS — R350 Frequency of micturition: Secondary | ICD-10-CM | POA: Diagnosis not present

## 2016-03-15 DIAGNOSIS — R7301 Impaired fasting glucose: Secondary | ICD-10-CM | POA: Diagnosis not present

## 2016-03-15 DIAGNOSIS — E785 Hyperlipidemia, unspecified: Secondary | ICD-10-CM | POA: Diagnosis not present

## 2016-03-15 DIAGNOSIS — R931 Abnormal findings on diagnostic imaging of heart and coronary circulation: Secondary | ICD-10-CM | POA: Diagnosis not present

## 2016-04-13 DIAGNOSIS — H401131 Primary open-angle glaucoma, bilateral, mild stage: Secondary | ICD-10-CM | POA: Diagnosis not present

## 2016-04-23 DIAGNOSIS — G43909 Migraine, unspecified, not intractable, without status migrainosus: Secondary | ICD-10-CM | POA: Diagnosis not present

## 2016-04-23 DIAGNOSIS — J069 Acute upper respiratory infection, unspecified: Secondary | ICD-10-CM | POA: Diagnosis not present

## 2016-05-20 DIAGNOSIS — Z85828 Personal history of other malignant neoplasm of skin: Secondary | ICD-10-CM | POA: Diagnosis not present

## 2016-05-20 DIAGNOSIS — D485 Neoplasm of uncertain behavior of skin: Secondary | ICD-10-CM | POA: Diagnosis not present

## 2016-05-20 DIAGNOSIS — L820 Inflamed seborrheic keratosis: Secondary | ICD-10-CM | POA: Diagnosis not present

## 2016-05-20 DIAGNOSIS — L718 Other rosacea: Secondary | ICD-10-CM | POA: Diagnosis not present

## 2016-06-04 DIAGNOSIS — Z23 Encounter for immunization: Secondary | ICD-10-CM | POA: Diagnosis not present

## 2016-07-19 DIAGNOSIS — E785 Hyperlipidemia, unspecified: Secondary | ICD-10-CM | POA: Diagnosis not present

## 2016-07-19 DIAGNOSIS — G43909 Migraine, unspecified, not intractable, without status migrainosus: Secondary | ICD-10-CM | POA: Diagnosis not present

## 2016-07-19 DIAGNOSIS — E559 Vitamin D deficiency, unspecified: Secondary | ICD-10-CM | POA: Diagnosis not present

## 2016-07-19 DIAGNOSIS — M545 Low back pain: Secondary | ICD-10-CM | POA: Diagnosis not present

## 2016-07-19 DIAGNOSIS — B0229 Other postherpetic nervous system involvement: Secondary | ICD-10-CM | POA: Diagnosis not present

## 2016-07-19 DIAGNOSIS — E039 Hypothyroidism, unspecified: Secondary | ICD-10-CM | POA: Diagnosis not present

## 2016-07-19 DIAGNOSIS — D696 Thrombocytopenia, unspecified: Secondary | ICD-10-CM | POA: Diagnosis not present

## 2016-07-19 DIAGNOSIS — R7301 Impaired fasting glucose: Secondary | ICD-10-CM | POA: Diagnosis not present

## 2016-07-22 ENCOUNTER — Encounter: Payer: Self-pay | Admitting: Neurology

## 2016-07-22 ENCOUNTER — Ambulatory Visit (INDEPENDENT_AMBULATORY_CARE_PROVIDER_SITE_OTHER): Payer: Medicare Other | Admitting: Neurology

## 2016-07-22 VITALS — BP 132/72 | HR 78 | Resp 14 | Ht 61.5 in | Wt 161.0 lb

## 2016-07-22 DIAGNOSIS — H903 Sensorineural hearing loss, bilateral: Secondary | ICD-10-CM

## 2016-07-22 DIAGNOSIS — G3184 Mild cognitive impairment, so stated: Secondary | ICD-10-CM

## 2016-07-22 MED ORDER — DONEPEZIL HCL 10 MG PO TABS: 10.0000 mg | ORAL_TABLET | Freq: Every day | ORAL | 3 refills | Status: DC

## 2016-07-22 NOTE — Progress Notes (Signed)
[facility]  PATIENT: Brittney [name] DOB: [birth year]   REASON FOR VISIT: Follow-up for dementia, pseudo parasitic dysesthesias HISTORY FROM: Patient and husband    HISTORY OF PRESENT ILLNESS: 06/17/15 CDMariola Sy [name] is a 81 y.o. female , seen here as a referral/ revisit from Dr. Wilhemina Bonito for pseudoparasitic sensory abnormalities, psychosis.  Chief complaint according to patient : " The patient feels that small lesions are developing all over her skin including at the root of the eyelashes and upper lip"  She reports a sensation of movement under her skin. She does not feel that these are nocturnal or daytime active sensations but that they occur any time and that there perpetually there. She has tried with Q-tips to manipulate to get to the bottom of the skin lesions but has actually created some skin lesions in the process.  She had seen Dr. Wilhemina Bonito at [facility] dermatology and endorsed symptoms of skin irritation little, bumps -changing in size, Itchiness,tenderness, sometimes soreness but also a creepy crawly sensation under the skin. This does not affect one region of the body alone but includes arms lower extremities trunk and face. His causes the patient discomfort and also has affected her sleep and actually consumes a significant time of her thoughts and daytime. Her husband agrees.  In addition Dr. Ronnald Ramp eluded to some cognitive difficulties she may have developed. He was concerned about  beginning memory loss disorder. Brittney [name] underwent 2 tests here today a Mini-Mental Status examination as well as a Montral cognitive assessment test. On the Mini-Mental Status Examination she missed 5 out of 5 points on attention and calculation and could not recall any of the 3 recall words.. I would have given her 22 out of 30 points for this performance. The Montral cognitive assessment test is much more difficult she was not able to solve the trail  making test or draw a three-dimensional cube. She placed the contour and numbers into a clock face but not the hands. She was able to name 3 out of 3 animals was able to repeat a series of digits but had trouble with his serial 7 again. She was able to abstract, her word fluency was good with 11 F words, and she scored 6 out of 6 points on orientation date and place. This is similar to her Mini-Mental Status Examination.  It is unusual for patient to be fully oriented to date place and time and have primarily visual spatial difficulties and recall difficulties. It is clear visit short-term memory is affected but thus it should affect date and time as well. The patient is aware of her cognitive deficits and day-to-day life. The couple recently stayed in a hotel and the patient was not sure which direction to go to the elevator for example. She has not gotten lost outside. The patient prepares about 1 warm meal per week, she states she now wears gloves and a Cap , is afraid to contaminate the food. She has never balanced the chequebook, she only worked 10 years of her life outside the home. She worked for [employer].   The patient is the oldest of 5 siblings and as far she knows not no other sibling has difficulties with cognition or memory. Her parents were not affected by dementia. She has 1 maternal first cousin who suffers from Alzheimer's disease.  UPDATE11/29/16 [name], 81 year old female returns for follow-up. She was initially evaluated on 06/17/2015 by Dr. Brett Fairy for memory loss and pseudo-parasitic sensory  abnormalities. She continues to report a sensation of movement under her skin at times. She feels that her memory is better . MRI of the brain performed 07/06/2015 without acute findings but nonspecific chronic microvascular ischemic changes. Thyroid and B12 levels were normal She was placed on Seroquel and Aricept. She returns for follow-up UPDATE 06/01/2017CM Brittney [name], 81 year old female  returns for follow-up. She has a history of memory loss and is currently on Aricept 10 mg at bedtime however she reports vivid dreaming and she was asked to take in the morning. She continues to have occasional migraine is followed by her primary care and she takes Fioricet. Her memory score stable today. She tries to stay active. She returns for reevaluation with her husband.  Interval history from 07/22/2016, I have the pleasure of seeing Brittney [name] with her husband today she is meanwhile 81 years old and returned for a memory test. A Mini-Mental Status Examination was performed and she scored 29 out of 30 points again. She continues to take Aricept and feels that it has helped her. By today score she would not be considered having a memory loss disorder but a possible mild cognitive impairment, which I attributed to her hearing loss.  REVIEW OF SYSTEMS: Full 14 system review of systems performed and notable only for those listed, all others are neg:  Musculoskeletal: Joint pain IBS- diverticulitis,  Neurological: History of headaches, memory loss, hearing loss  SLEEP- worried.    ALLERGIES: Allergies  Allergen Reactions  . Amitriptyline Other (See Comments)    Skewed vision  . Latex Other (See Comments)    Blisters skin  . Lipitor [Atorvastatin] Other (See Comments)    myalgias  . Meperidine Other (See Comments)    Mental status changes  . Motrin [Ibuprofen] Swelling  . Rosuvastatin Other (See Comments)    Joint and muscle pain  . Sulfa Antibiotics Other (See Comments)    Reaction unknown  . Vioxx [Rofecoxib] Other (See Comments)    Reaction unknown  . Augmentin [Amoxicillin-Pot Clavulanate] Rash and Other (See Comments)    HOME MEDICATIONS: Outpatient Medications Prior to Visit  Medication Sig Dispense Refill  . bimatoprost (LUMIGAN) 0.03 % ophthalmic solution Place 1 drop into both eyes at bedtime.    . butalbital-acetaminophen-caffeine (FIORICET, ESGIC) 50-325-40 MG tablet  take 1 tablet by mouth if needed for headache, no more than 1 a day  0  . cyclobenzaprine (FLEXERIL) 5 MG tablet Take 5 mg by mouth 3 (three) times daily as needed for muscle spasms.    [name] donepezil (ARICEPT) 10 MG tablet Take 1 tablet (10 mg total) by mouth at bedtime. 30 tablet 6  . ezetimibe (ZETIA) 10 MG tablet Take 10 mg by mouth daily.    [name] gabapentin (NEURONTIN) 100 MG capsule Take 100 mg by mouth every other day.    . levothyroxine (SYNTHROID, LEVOTHROID) 75 MCG tablet Take 75 mcg by mouth daily before breakfast.    . timolol (TIMOPTIC) 0.25 % ophthalmic solution instill 1 drop into both eyes once daily  0  . fluticasone (FLONASE) 50 MCG/ACT nasal spray Place 2 sprays into both nostrils daily as needed for rhinitis.     [name] meclizine (ANTIVERT) 12.5 MG tablet Take 12.5 mg by mouth 3 (three) times daily as needed for dizziness.     No facility-administered medications prior to visit.     PAST MEDICAL HISTORY: Past Medical History:  Diagnosis Date  . CKD (chronic kidney disease), stage III   . Familial  hypercholesterolemia   . GERD (gastroesophageal reflux disease)   . Glaucoma   . Hypothyroid   . Migraine headache   . Post herpetic neuralgia     PAST SURGICAL HISTORY: Past Surgical History:  Procedure Laterality Date  . ANAL FISSURE REPAIR    . APPENDECTOMY    . BASAL CELL CARCINOMA EXCISION    . cataract surgery     left  . CHOLECYSTECTOMY    . hemithyroidectomy    . INCONTINENCE SURGERY    . THYROIDECTOMY    . TUBAL LIGATION    . VAGINAL HYSTERECTOMY    . vaginal vault repair      FAMILY HISTORY: Family History  Problem Relation Age of Onset  . Heart disease Mother     chf  . Heart disease Sister     CABG    SOCIAL HISTORY: Social History   Social History  . Marital status: Married    Spouse name: Nadara Mustard  . Number of children: 4  . Years of education: 12   Occupational History  . Not on file.   Social History Main Topics  . Smoking status: Never  Smoker  . Smokeless tobacco: Not on file  . Alcohol use Not on file  . Drug use: Unknown  . Sexual activity: Not on file   Other Topics Concern  . Not on file   Social History Narrative  . No narrative on file     PHYSICAL EXAM  Vitals:   07/22/16 1017  BP: 132/72  Pulse: 78  Resp: 14  Weight: 161 lb (73 kg)  Height: 5' 1.5" (1.562 m)   Body mass index is 29.93 kg/m.    Generalized: Well developed, in no acute distress, well groomed  Head: normocephalic and atraumatic,. Oropharynx benign  Neck: Supple, no carotid bruits  Cardiac: Regular rate rhythm, no murmur  Musculoskeletal: No deformity   Neurological examination  MMSE - Mini Mental State Exam 07/22/2016 01/15/2016 06/17/2015  Orientation to time 5 5 5   Orientation to Place 5 5 5   Registration 3 3 3   Attention/ Calculation 5 4 0  Recall 2 3 3   Language- name 2 objects 2 2 2   Language- repeat 1 1 1   Language- follow 3 step command 3 3 3   Language- read & follow direction 1 1 1   Write a sentence 1 1 1   Copy design 1 1 0  Copy design-comments - difficult to see lines due to redrawing over lines -  Total score 29 29 24      Mentation: Alert oriented to time, place, history taking. Attention span and concentration appropriate. Recent and remote memory intact. Follows all commands speech and language fluent. Cranial nerve ;Pupils were equal round reactive to light extraocular movements were full, visual field were full on confrontational test. Facial sensation and strength were normal. hearing impaired has hearing aids, bilaterally. Uvula and  tongue midline. head turning and shoulder shrug were normal and symmetric.Tongue protrusion into cheek strength was normal. Motor: normal bulk and tone, full strength, fine finger movements normal, no pronator drift. No focal weakness Sensory: normal and symmetric to light touch, pinprick, and Vibration,  Coordination: finger-nose-finger, heel-to-shin bilaterally, no  dysmetria Reflexes:  2/2, plantar responses were flexor bilaterally. Gait and Station: Rising up from seated position without assistance, normal stance,  moderate stride, good arm swing, smooth turning, able to perform tiptoe, and heel walking without difficulty. Tandem gait is mildly unsteady  DIAGNOSTIC DATA (LABS, IMAGING, TESTING)  ASSESSMENT AND PLAN  81 y.o. year old female  has a past medical history of memory loss and pseudo parasitic dysthesias.  This little parasitic dysesthesias have completely resolved, and she does feel more alert and more oriented to her surroundings. Her hearing loss is severe and bilaterally present and not fully compensated for by hearing aids. I believe that this is the origin of her perceived cognitive impairment. She has felt better since using Aricept and for this reason I will continue the medication for the next 12 month and see her in one year. MOCA next visit with MMSE.   Continue Aricept at 10 mg will refill for 12 months Memory score is stable Follow-up in 12 months with me ,  Dr. Stefan Church, MD  [facility] [address] [place], [place] [address] [phone] An

## 2016-07-22 NOTE — Patient Instructions (Signed)

## 2016-08-25 DIAGNOSIS — E559 Vitamin D deficiency, unspecified: Secondary | ICD-10-CM | POA: Diagnosis not present

## 2016-08-25 DIAGNOSIS — D759 Disease of blood and blood-forming organs, unspecified: Secondary | ICD-10-CM | POA: Diagnosis not present

## 2016-10-05 DIAGNOSIS — M791 Myalgia: Secondary | ICD-10-CM | POA: Diagnosis not present

## 2016-10-05 DIAGNOSIS — R52 Pain, unspecified: Secondary | ICD-10-CM | POA: Diagnosis not present

## 2016-10-12 DIAGNOSIS — M549 Dorsalgia, unspecified: Secondary | ICD-10-CM | POA: Diagnosis not present

## 2016-10-12 DIAGNOSIS — R002 Palpitations: Secondary | ICD-10-CM | POA: Diagnosis not present

## 2016-10-12 DIAGNOSIS — R8290 Unspecified abnormal findings in urine: Secondary | ICD-10-CM | POA: Diagnosis not present

## 2016-10-12 DIAGNOSIS — R350 Frequency of micturition: Secondary | ICD-10-CM | POA: Diagnosis not present

## 2016-10-14 ENCOUNTER — Telehealth: Payer: Self-pay | Admitting: Cardiology

## 2016-10-14 NOTE — Telephone Encounter (Signed)
Notes sent to scheduling.   

## 2016-10-19 DIAGNOSIS — E559 Vitamin D deficiency, unspecified: Secondary | ICD-10-CM | POA: Diagnosis not present

## 2016-10-19 DIAGNOSIS — R109 Unspecified abdominal pain: Secondary | ICD-10-CM | POA: Diagnosis not present

## 2016-10-19 DIAGNOSIS — M545 Low back pain: Secondary | ICD-10-CM | POA: Diagnosis not present

## 2016-10-22 ENCOUNTER — Other Ambulatory Visit: Payer: Self-pay | Admitting: Family Medicine

## 2016-10-22 DIAGNOSIS — R109 Unspecified abdominal pain: Secondary | ICD-10-CM

## 2016-11-01 ENCOUNTER — Ambulatory Visit
Admission: RE | Admit: 2016-11-01 | Discharge: 2016-11-01 | Disposition: A | Discharge status: Home / Self Care | Payer: Medicare Other | Source: Ambulatory Visit | Attending: Family Medicine | Admitting: Family Medicine

## 2016-11-01 DIAGNOSIS — R109 Unspecified abdominal pain: Secondary | ICD-10-CM

## 2016-11-09 DIAGNOSIS — H401131 Primary open-angle glaucoma, bilateral, mild stage: Secondary | ICD-10-CM | POA: Diagnosis not present

## 2016-11-17 ENCOUNTER — Telehealth: Payer: Self-pay | Admitting: Cardiovascular Disease

## 2016-11-17 NOTE — Telephone Encounter (Signed)
Received records from [facility] for appointment on 11/30/16 with Dr Gwenlyn Found.  Records put with Dr Kennon Holter schedule for 11/30/16. lp

## 2016-11-25 DIAGNOSIS — J209 Acute bronchitis, unspecified: Secondary | ICD-10-CM | POA: Diagnosis not present

## 2016-11-30 ENCOUNTER — Ambulatory Visit: Payer: Medicare Other | Admitting: Cardiovascular Disease

## 2016-12-13 ENCOUNTER — Other Ambulatory Visit: Payer: Self-pay

## 2016-12-14 ENCOUNTER — Encounter: Payer: Self-pay | Admitting: Cardiovascular Disease

## 2016-12-14 ENCOUNTER — Ambulatory Visit (INDEPENDENT_AMBULATORY_CARE_PROVIDER_SITE_OTHER): Payer: Medicare Other | Admitting: Cardiovascular Disease

## 2016-12-14 VITALS — BP 178/84 | HR 70 | Ht 61.0 in | Wt 161.4 lb

## 2016-12-14 DIAGNOSIS — R002 Palpitations: Secondary | ICD-10-CM | POA: Diagnosis not present

## 2016-12-14 DIAGNOSIS — R011 Cardiac murmur, unspecified: Secondary | ICD-10-CM

## 2016-12-14 NOTE — Assessment & Plan Note (Signed)
History of heart murmur in the past although I do not hear one today. We will recheck a 2-D echocardiogram

## 2016-12-14 NOTE — Patient Instructions (Signed)
Medication Instructions: No changes   Labwork: None ordered   Testing/Procedures:  Your physician has requested that you have an echocardiogram. Echocardiography is a painless test that uses sound waves to create images of your heart. It provides your doctor with information about the size and shape of your heart and how well your heart's chambers and valves are working. This procedure takes approximately one hour. There are no restrictions for this procedure.   Your physician has recommended that you wear an event monitor. Event monitors are medical devices that record the heart's electrical activity. Doctors most often [place] these monitors to diagnose arrhythmias. Arrhythmias are problems with the speed or rhythm of the heartbeat. The monitor is a small, portable device. You can wear one while you do your normal daily activities. This is usually used to diagnose what is causing palpitations/syncope (passing out).   We will call you with the outcome of these tests. The event monitor results will be called back after the monitor is returned, if no alert/arrythmias. We will call you sooner if abnormal heart rate/rhythm are recorded.  Follow-Up: as needed with Dr. Gwenlyn Found.    If you need a refill on your cardiac medications before your next appointment, please call your pharmacy.

## 2016-12-14 NOTE — Assessment & Plan Note (Signed)
New-onset palpitations or last several months occurring several times a week lasting for minutes at a time without associated symptoms. We will check a 2 week event monitor to further evaluate

## 2016-12-14 NOTE — Progress Notes (Signed)
12/14/2016 Brittney [name]   April 19, 1935  [id number]  Primary Physician Gerrit Heck, MD Primary Cardiologist: Lorretta Harp MD Renae Gloss  HPI:  [name] is a very pleasant 82 year old mild to moderately overweight married Caucasian female mother of 73, grandmother of 7 grandchildren referred by Dr. Drema Dallas for cardiovascular evaluation because of palpitations. She does have a history of hyperlipidemia intolerant to statin 10 therapy on Zetia. She has a family history with father that had a myocardial infarction in his early 42s. She's been told that she has a murmur or valvular heart disease I echo in the past. She's never had a heart attack or stroke. She denies chest pain or shortness of breath. She does have fibromyalgia. She's had bronchitis for the last 6 weeks or so. She's noticed palpitations last 2-3 months occurring several times per week lasting for minutes at a time. Apparently thyroid function tests were assessed and were normal.   Current Outpatient Prescriptions  Medication Sig Dispense Refill  . bimatoprost (LUMIGAN) 0.03 % ophthalmic solution Place 1 drop into both eyes at bedtime.    . butalbital-acetaminophen-caffeine (FIORICET, ESGIC) 50-325-40 MG tablet take 1 tablet by mouth if needed for headache, no more than 1 a day  0  . cefUROXime (CEFTIN) 250 MG tablet take 1 tablet by mouth twice a day for 7 days  0  . cyclobenzaprine (FLEXERIL) 5 MG tablet Take 5 mg by mouth 3 (three) times daily as needed for muscle spasms.    [name] donepezil (ARICEPT) 10 MG tablet Take 1 tablet (10 mg total) by mouth at bedtime. 90 tablet 3  . ezetimibe (ZETIA) 10 MG tablet Take 10 mg by mouth daily.    [name] gabapentin (NEURONTIN) 100 MG capsule Take 100 mg by mouth every other day.    . levothyroxine (SYNTHROID, LEVOTHROID) 75 MCG tablet Take 75 mcg by mouth daily before breakfast.    . timolol (TIMOPTIC) 0.25 % ophthalmic solution instill 1 drop into both eyes once daily   0   No current facility-administered medications for this visit.     Allergies  Allergen Reactions  . Amitriptyline Other (See Comments)    Skewed vision  . Latex Other (See Comments)    Blisters skin  . Meperidine Other (See Comments)    Mental status changes  . Sulfa Antibiotics Other (See Comments)    Reaction unknown  . Vioxx [Rofecoxib] Other (See Comments)    Reaction unknown  . Augmentin [Amoxicillin-Pot Clavulanate] Rash and Other (See Comments)  . Lipitor [Atorvastatin] Other (See Comments)    myalgias  . Motrin [Ibuprofen] Swelling  . Rosuvastatin Other (See Comments)    Joint and muscle pain    Social History   Social History  . Marital status: Married    Spouse name: Nadara Mustard  . Number of children: 4  . Years of education: 12   Occupational History  . Not on file.   Social History Main Topics  . Smoking status: Never Smoker  . Smokeless tobacco: Never Used  . Alcohol use Not on file  . Drug use: Unknown  . Sexual activity: Not on file   Other Topics Concern  . Not on file   Social History Narrative  . No narrative on file     Review of Systems: General: negative for chills, fever, night sweats or weight changes.  Cardiovascular: negative for chest pain, dyspnea on exertion, edema, orthopnea, palpitations, paroxysmal nocturnal dyspnea or shortness of breath Dermatological:  negative for rash Respiratory: negative for cough or wheezing Urologic: negative for hematuria Abdominal: negative for nausea, vomiting, diarrhea, bright red blood per rectum, melena, or hematemesis Neurologic: negative for visual changes, syncope, or dizziness All other systems reviewed and are otherwise negative except as noted above.    Blood pressure (!) 178/84, pulse 70, height 5\' 1"  (1.549 m), weight 161 lb 6.4 oz (73.2 kg).  General appearance: alert and no distress Neck: no adenopathy, no carotid bruit, no JVD, supple, symmetrical, trachea midline and thyroid not  enlarged, symmetric, no tenderness/mass/nodules Lungs: clear to auscultation bilaterally Heart: regular rate and rhythm, S1, S2 normal, no murmur, click, rub or gallop Extremities: extremities normal, atraumatic, no cyanosis or edema  EKG sinus rhythm at 70 without ST or T-wave changes. I personally reviewed this EKG  ASSESSMENT AND PLAN:   Palpitations New-onset palpitations or last several months occurring several times a week lasting for minutes at a time without associated symptoms. We will check a 2 week event monitor to further evaluate  Familial hypercholesterolemia History of hyperlipidemia intolerant to statin therapy on Zetia followed by her PCP  Heart murmur History of heart murmur in the past although I do not hear one today. We will recheck a 2-D echocardiogram      Lorretta Harp MD [facility], [facility] 12/14/2016 3:24 PM

## 2016-12-14 NOTE — Assessment & Plan Note (Signed)
History of hyperlipidemia intolerant to statin therapy on Zetia followed by her PCP 

## 2016-12-17 DIAGNOSIS — R05 Cough: Secondary | ICD-10-CM | POA: Diagnosis not present

## 2017-01-04 ENCOUNTER — Ambulatory Visit (INDEPENDENT_AMBULATORY_CARE_PROVIDER_SITE_OTHER): Payer: Medicare Other

## 2017-01-04 ENCOUNTER — Other Ambulatory Visit: Payer: Self-pay

## 2017-01-04 ENCOUNTER — Ambulatory Visit (HOSPITAL_COMMUNITY): Payer: Medicare Other | Attending: Cardiovascular Disease

## 2017-01-04 DIAGNOSIS — I340 Nonrheumatic mitral (valve) insufficiency: Secondary | ICD-10-CM | POA: Diagnosis not present

## 2017-01-04 DIAGNOSIS — R002 Palpitations: Secondary | ICD-10-CM

## 2017-01-04 DIAGNOSIS — R011 Cardiac murmur, unspecified: Secondary | ICD-10-CM

## 2017-01-05 ENCOUNTER — Telehealth: Payer: Self-pay | Admitting: Cardiovascular Disease

## 2017-01-05 NOTE — Telephone Encounter (Signed)
Returning your call,she says it might be about her echo results

## 2017-01-06 NOTE — Telephone Encounter (Signed)
Patient made aware of the results and verbalized her understanding.   Notes recorded by Lorretta Harp, MD on 01/04/2017 at 5:06 PM EDT Essentially normal study. Repeat when clinically indicated

## 2017-01-06 NOTE — Telephone Encounter (Signed)
Follow up    Pt is calling returning call about echo results.

## 2017-01-25 DIAGNOSIS — Z6830 Body mass index (BMI) 30.0-30.9, adult: Secondary | ICD-10-CM | POA: Diagnosis not present

## 2017-01-25 DIAGNOSIS — Z01419 Encounter for gynecological examination (general) (routine) without abnormal findings: Secondary | ICD-10-CM | POA: Diagnosis not present

## 2017-02-11 DIAGNOSIS — Z803 Family history of malignant neoplasm of breast: Secondary | ICD-10-CM | POA: Diagnosis not present

## 2017-02-11 DIAGNOSIS — Z1231 Encounter for screening mammogram for malignant neoplasm of breast: Secondary | ICD-10-CM | POA: Diagnosis not present

## 2017-03-17 DIAGNOSIS — H903 Sensorineural hearing loss, bilateral: Secondary | ICD-10-CM | POA: Diagnosis not present

## 2017-03-18 DIAGNOSIS — E039 Hypothyroidism, unspecified: Secondary | ICD-10-CM | POA: Diagnosis not present

## 2017-03-18 DIAGNOSIS — N183 Chronic kidney disease, stage 3 (moderate): Secondary | ICD-10-CM | POA: Diagnosis not present

## 2017-03-18 DIAGNOSIS — Z1389 Encounter for screening for other disorder: Secondary | ICD-10-CM | POA: Diagnosis not present

## 2017-03-18 DIAGNOSIS — Z0000 Encounter for general adult medical examination without abnormal findings: Secondary | ICD-10-CM | POA: Diagnosis not present

## 2017-03-18 DIAGNOSIS — D696 Thrombocytopenia, unspecified: Secondary | ICD-10-CM | POA: Diagnosis not present

## 2017-03-18 DIAGNOSIS — H9190 Unspecified hearing loss, unspecified ear: Secondary | ICD-10-CM | POA: Diagnosis not present

## 2017-03-18 DIAGNOSIS — E559 Vitamin D deficiency, unspecified: Secondary | ICD-10-CM | POA: Diagnosis not present

## 2017-03-18 DIAGNOSIS — E785 Hyperlipidemia, unspecified: Secondary | ICD-10-CM | POA: Diagnosis not present

## 2017-03-18 DIAGNOSIS — R7301 Impaired fasting glucose: Secondary | ICD-10-CM | POA: Diagnosis not present

## 2017-03-18 DIAGNOSIS — R413 Other amnesia: Secondary | ICD-10-CM | POA: Diagnosis not present

## 2017-04-28 DIAGNOSIS — Z23 Encounter for immunization: Secondary | ICD-10-CM | POA: Diagnosis not present

## 2017-05-17 DIAGNOSIS — H401131 Primary open-angle glaucoma, bilateral, mild stage: Secondary | ICD-10-CM | POA: Diagnosis not present

## 2017-05-19 DIAGNOSIS — R42 Dizziness and giddiness: Secondary | ICD-10-CM | POA: Diagnosis not present

## 2017-05-19 DIAGNOSIS — H6590 Unspecified nonsuppurative otitis media, unspecified ear: Secondary | ICD-10-CM | POA: Diagnosis not present

## 2017-05-19 DIAGNOSIS — G43909 Migraine, unspecified, not intractable, without status migrainosus: Secondary | ICD-10-CM | POA: Diagnosis not present

## 2017-05-19 DIAGNOSIS — G44209 Tension-type headache, unspecified, not intractable: Secondary | ICD-10-CM | POA: Diagnosis not present

## 2017-05-23 ENCOUNTER — Other Ambulatory Visit: Payer: Self-pay | Admitting: Family Medicine

## 2017-05-23 DIAGNOSIS — G43909 Migraine, unspecified, not intractable, without status migrainosus: Secondary | ICD-10-CM

## 2017-05-26 ENCOUNTER — Ambulatory Visit: Payer: Medicare Other | Admitting: Neurology

## 2017-05-26 ENCOUNTER — Telehealth: Payer: Self-pay

## 2017-05-26 NOTE — Telephone Encounter (Signed)
I called patient to reschedule her OV today due to Dr. Brett Fairy needing to leave suddenly. Patient was ok with this and we rescheduled to 06/28/17. I have also put her on the cancellation list.

## 2017-06-06 ENCOUNTER — Ambulatory Visit
Admission: RE | Admit: 2017-06-06 | Discharge: 2017-06-06 | Disposition: A | Discharge status: Home / Self Care | Payer: Medicare Other | Source: Ambulatory Visit | Attending: Family Medicine | Admitting: Family Medicine

## 2017-06-06 DIAGNOSIS — G43909 Migraine, unspecified, not intractable, without status migrainosus: Secondary | ICD-10-CM | POA: Diagnosis not present

## 2017-06-28 ENCOUNTER — Institutional Professional Consult (permissible substitution): Payer: Self-pay | Admitting: Neurology

## 2017-08-03 ENCOUNTER — Encounter: Payer: Self-pay | Admitting: Neurology

## 2017-08-03 ENCOUNTER — Ambulatory Visit (INDEPENDENT_AMBULATORY_CARE_PROVIDER_SITE_OTHER): Payer: Medicare Other | Admitting: Neurology

## 2017-08-03 VITALS — BP 171/71 | HR 61 | Ht 61.0 in | Wt 162.0 lb

## 2017-08-03 DIAGNOSIS — I6789 Other cerebrovascular disease: Secondary | ICD-10-CM | POA: Insufficient documentation

## 2017-08-03 DIAGNOSIS — H81319 Aural vertigo, unspecified ear: Secondary | ICD-10-CM | POA: Diagnosis not present

## 2017-08-03 DIAGNOSIS — I679 Cerebrovascular disease, unspecified: Secondary | ICD-10-CM

## 2017-08-03 DIAGNOSIS — G43011 Migraine without aura, intractable, with status migrainosus: Secondary | ICD-10-CM | POA: Diagnosis not present

## 2017-08-03 DIAGNOSIS — H8309 Labyrinthitis, unspecified ear: Secondary | ICD-10-CM | POA: Diagnosis not present

## 2017-08-03 MED ORDER — DONEPEZIL HCL 10 MG PO TABS: 10.0000 mg | ORAL_TABLET | Freq: Every day | ORAL | 3 refills | Status: DC

## 2017-08-03 MED ORDER — ALPRAZOLAM 0.5 MG PO TABS: 0.5000 mg | ORAL_TABLET | ORAL | 0 refills | Status: DC | PRN

## 2017-08-03 NOTE — Progress Notes (Signed)
[facility]  PATIENT: Brittney [name] DOB: 06-Jun-1935   REASON FOR VISIT: Follow-up for dementia, pseudo parasitic dysesthesias HISTORY FROM: Patient and husband   HISTORY OF PRESENT ILLNESS:  06/17/15 CDMariola Sy [name] is a 82 y.o. female , seen here as a referral/ revisit from Dr. Wilhemina Bonito for pseudoparasitic sensory abnormalities, psychosis.  Chief complaint according to patient : " The patient feels that small lesions are developing all over her skin including at the root of the eyelashes and upper lip"  She reports a sensation of movement under her skin. She does not feel that these are nocturnal or daytime active sensations but that they occur any time and that there perpetually there. She has tried with Q-tips to manipulate to get to the bottom of the skin lesions but has actually created some skin lesions in the process.  She had seen Dr. Wilhemina Bonito at [facility] dermatology and endorsed symptoms of skin irritation little, bumps -changing in size, Itchiness,tenderness, sometimes soreness but also a creepy crawly sensation under the skin. This does not affect one region of the body alone but includes arms lower extremities trunk and face. His causes the patient discomfort and also has affected her sleep and actually consumes a significant time of her thoughts and daytime. Her husband agrees.  In addition Dr. Ronnald Ramp eluded to some cognitive difficulties she may have developed. He was concerned about  beginning memory loss disorder. Mrs. Brittney [name] underwent 2 tests here today a Mini-Mental Status examination as well as a [place] cognitive assessment test. On the Mini-Mental Status Examination she missed 5 out of 5 points on attention and calculation and could not recall any of the 3 recall words.. I would have given her 22 out of 30 points for this performance. The [place] cognitive assessment test is much more difficult she was not able to solve the trail  making test or draw a three-dimensional cube. She placed the contour and numbers into a clock face but not the hands. She was able to name 3 out of 3 animals was able to repeat a series of digits but had trouble with his serial 7 again. She was able to abstract, her word fluency was good with 11 F words, and she scored 6 out of 6 points on orientation date and place. This is similar to her Mini-Mental Status Examination.  It is unusual for patient to be fully oriented to date place and time and have primarily visual spatial difficulties and recall difficulties.  It is clear visit short-term memory is affected but thus it should affect date and time as well. The patient is aware of her cognitive deficits and day-to-day life. The couple recently stayed in a hotel and the patient was not sure which direction to go to the elevator for example. She has not gotten lost outside. The patient prepares about 1 warm meal per week, she states she now wears gloves and a Cap , is afraid to contaminate the food. She has never balanced the chequebook, she only worked 10 years of her life outside the home. She worked for [employer].   The patient is the oldest of 5 siblings and as far she knows not no other sibling has difficulties with cognition or memory. Her parents were not affected by dementia. She has 1 maternal first cousin who suffers from Alzheimer's disease.  UPDATE11/29/16 Brittney [name], 82 year old female returns for follow-up. She was initially evaluated on 06/17/2015 by Dr. Brett Fairy for memory loss and pseudo-parasitic  sensory abnormalities. She continues to report a sensation of movement under her skin at times. She feels that her memory is better . MRI of the brain performed 07/06/2015 without acute findings but nonspecific chronic microvascular ischemic changes. Thyroid and B12 levels were normal She was placed on Seroquel and Aricept. She returns for follow-up.  UPDATE 01/15/2016 CM Brittney. [name], 82 year old  female returns for follow-up. She has a history of memory loss and is currently on Aricept 10 mg at bedtime however she reports vivid dreaming and she was asked to take in the morning. She continues to have occasional migraine is followed by her primary care and she takes Fioricet. Her memory score stable today. She tries to stay active. She returns for reevaluation with her husband.  Interval history from 07/22/2016, I have the pleasure of seeing Brittney [name] with her husband today she is meanwhile 39 years old and returned for a memory test. A Mini-Mental Status Examination was performed and she scored 29 out of 30 points again. She continues to take Aricept and feels that it has helped her. By today score she would not be considered having a memory loss disorder but a possible mild cognitive impairment, which I attributed to her hearing loss.  History from 03 August 2017.  I have the pleasure of seeing Brittney [name] again today for a yearly visit following her memory loss.  This year we performed a [place] cognitive assessment and the patient scored 20 out of 30 points.  She had some difficulties with visual spatial recognition, she did not have difficulties with naming objects, repeating and repeating numbers.  She did very well with serial subtraction of sevens, she was able to abstract and she recalled 3 out of 5 immediate recall words.  She is fully oriented to place and time. The patient would like to be evaluated for migrainous headaches today, but also she is an established patient this will be a new problem.  The patient reports that her migraines started acutely on August 26 while on vacation she woke up in the early morning hours with a severe headache on both sides of her head, as she set up on her bedside she developed vertigo with the room was spinning rapidly around her, clockwise,  she felt very ill. Even when she got back to bed and laid down did the vertigo persist. She felt nauseated  and weak, and off balance. It resolved after 1 day, but some vertigo was still present.  2 days later she had another migraine with vertigo,this one  lasting 3 days.   On Nov 7th she was in bed, got dizzy and violently vomited, she also developed diarrhea.  On Nov 9th migraine again, with dizziness.  Nov 27th migraine , no dizziness.     Montreal Cognitive Assessment  08/03/2017 07/15/2015 06/17/2015  Visuospatial/ Executive (0/5) 2 4 2   Naming (0/3) 2 3 3   Attention: Read list of digits (0/2) 2 2 1   Attention: Read list of letters (0/1) 1 0 0  Attention: Serial 7 subtraction starting at 100 (0/3) 3 3 0  Language: Repeat phrase (0/2) 1 2 2   Language : Fluency (0/1) 0 0 1  Abstraction (0/2) 1 2 2   Delayed Recall (0/5) 3 3 0  Orientation (0/6) 5 5 6   Total 20 24 17   Adjusted Score (based on education) - - 18      REVIEW OF SYSTEMS: Full 14 system review of systems performed and notable only for those listed,  all others are neg:  Musculoskeletal: Joint pain IBS- diverticulitis,  Neurological: History of headaches, memory loss, hearing loss  SLEEP- worried.    ALLERGIES: Allergies  Allergen Reactions  . Amitriptyline Other (See Comments)    Skewed vision  . Latex Other (See Comments)    Blisters skin  . Meperidine Other (See Comments)    Mental status changes  . Sulfa Antibiotics Other (See Comments)    Reaction unknown  . Vioxx [Rofecoxib] Other (See Comments)    Reaction unknown  . Augmentin [Amoxicillin-Pot Clavulanate] Rash and Other (See Comments)  . Lipitor [Atorvastatin] Other (See Comments)    myalgias  . Motrin [Ibuprofen] Swelling  . Rosuvastatin Other (See Comments)    Joint and muscle pain    HOME MEDICATIONS: Outpatient Medications Prior to Visit  Medication Sig Dispense Refill  . bimatoprost (LUMIGAN) 0.03 % ophthalmic solution Place 1 drop into both eyes at bedtime.    . butalbital-acetaminophen-caffeine (FIORICET, ESGIC) 50-325-40 MG tablet take 1  tablet by mouth if needed for headache, no more than 1 a day  0  . donepezil (ARICEPT) 10 MG tablet Take 1 tablet (10 mg total) by mouth at bedtime. 90 tablet 3  . ezetimibe (ZETIA) 10 MG tablet Take 10 mg by mouth daily.    [name] gabapentin (NEURONTIN) 100 MG capsule Take 100 mg by mouth every other day.    . levothyroxine (SYNTHROID, LEVOTHROID) 75 MCG tablet Take 75 mcg by mouth daily before breakfast.    . timolol (TIMOPTIC) 0.25 % ophthalmic solution instill 1 drop into both eyes once daily  0  . tizanidine (ZANAFLEX) 2 MG capsule Take 2 mg by mouth 3 (three) times daily as needed for muscle spasms.    . cefUROXime (CEFTIN) 250 MG tablet take 1 tablet by mouth twice a day for 7 days  0  . cyclobenzaprine (FLEXERIL) 5 MG tablet Take 5 mg by mouth 3 (three) times daily as needed for muscle spasms.     No facility-administered medications prior to visit.     PAST MEDICAL HISTORY: Past Medical History:  Diagnosis Date  . CKD (chronic kidney disease), stage III ([place])   . Familial hypercholesterolemia   . GERD (gastroesophageal reflux disease)   . Glaucoma   . Hypothyroid   . Migraine headache   . Post herpetic neuralgia     PAST SURGICAL HISTORY: Past Surgical History:  Procedure Laterality Date  . ANAL FISSURE REPAIR    . APPENDECTOMY    . BASAL CELL CARCINOMA EXCISION    . cataract surgery     left  . CHOLECYSTECTOMY    . hemithyroidectomy    . INCONTINENCE SURGERY    . THYROIDECTOMY    . TUBAL LIGATION    . VAGINAL HYSTERECTOMY    . vaginal vault repair      FAMILY HISTORY: Family History  Problem Relation Age of Onset  . Heart disease Mother        chf  . Heart disease Sister        CABG    SOCIAL HISTORY: Social History   Socioeconomic History  . Marital status: Married    Spouse name: Nadara Mustard  . Number of children: 4  . Years of education: 52  . Highest education level: Not on file  Social Needs  . Financial resource strain: Not on file  . Food  insecurity - worry: Not on file  . Food insecurity - inability: Not on file  . Transportation needs -  medical: Not on file  . Transportation needs - non-medical: Not on file  Occupational History  . Not on file  Tobacco Use  . Smoking status: Never Smoker  . Smokeless tobacco: Never Used  Substance and Sexual Activity  . Alcohol use: Not on file  . Drug use: Not on file  . Sexual activity: Not on file  Other Topics Concern  . Not on file  Social History Narrative  . Not on file     PHYSICAL EXAM  Vitals:   08/03/17 1016  BP: (!) 171/71  Pulse: 61  Weight: 162 lb (73.5 kg)  Height: 5\' 1"  (1.549 m)   Body mass index is 30.61 kg/m.    Generalized: Well developed, in no acute distress, well groomed  Head: normocephalic and atraumatic,. Oropharynx benign  Neck: Supple, no carotid bruits  Cardiac: Regular rate rhythm, no murmur - no carotid bruits.  Musculoskeletal: No deformity   Neurological examination  MMSE - Mini Mental State Exam 07/22/2016 01/15/2016 06/17/2015  Orientation to time 5 5 5   Orientation to Place 5 5 5   Registration 3 3 3   Attention/ Calculation 5 4 0  Recall 2 3 3   Language- name 2 objects 2 2 2   Language- repeat 1 1 1   Language- follow 3 step command 3 3 3   Language- read & follow direction 1 1 1   Write a sentence 1 1 1   Copy design 1 1 0  Copy design-comments - difficult to see lines due to redrawing over lines -  Total score 29 29 24      Mentation: Alert oriented to time, place, history taking. Attention span and concentration appropriate. She explained that it is Wednesday, but couldn't come up with the day when tested.  Recent and remote memory intact. some visual spatial difficulties. .  Follows all commands speech and language fluent. Cranial nerve ;Pupils were equal round reactive to light, her  extraocular movements were full, visual field were full on confrontational test.  Facial sensation  normal. Symmetric facial strength.  hearing impaired has hearing aids, bilaterally.  Uvula and tongue move midline. head turning and shoulder shrug were normal and symmetric.Tongue protrusion into cheek strength was normal. Motor: normal bulk and tone, full strength, fine finger movements normal, no pronator drift. No focal weakness Sensory: normal and symmetric to light touch, pinprick, andvibration,  Coordination: finger-nose-finger intact  Reflexes:  2/2,  Gait and Station: Rising up from seated position without assistance, normal stance,moderate stride, good arm swing, smooth turning, able to perform tiptoe, and heel walking without difficulty. Tandem gait is mildly unsteady. She climbs steps without diffiullties.   DIAGNOSTIC DATA (LABS, IMAGING, TESTING)  MOCA and MMSE   Reviewed MRI brain 06-19-2017.  CLINICAL DATA:  Migraines.  EXAM: MRI HEAD WITHOUT CONTRAST  TECHNIQUE: Multiplanar, multiecho pulse sequences of the brain and surrounding structures were obtained without intravenous contrast.  COMPARISON:  07/06/2015  FINDINGS: Brain: There is no evidence of acute infarct, intracranial hemorrhage, mass, midline shift, or extra-axial fluid collection. The ventricles and sulci are normal for age. Patchy T2 hyperintensities scattered throughout the subcortical and deep cerebral white matter bilaterally and milder T2 hyperintensities in the brainstem are unchanged.  Vascular: Major intracranial vascular flow voids are preserved.  Skull and upper cervical spine: Unremarkable bone marrow signal.  Sinuses/Orbits: Left cataract extraction. Paranasal sinuses and mastoid air cells are clear.  Other: None.  IMPRESSION: 1. No acute intracranial abnormality. 2. Moderate cerebral white matter disease, unchanged and nonspecific though most  often seen with chronic small vessel ischemia.   Electronically Signed   By: Logan Bores M.D.   On: 06/06/2017 14:35  ASSESSMENT AND PLAN  82 y.o. year old  female presents today with a relatively recent onset of vertigo, this is a vertigo that causes a clockwise rotation sensation of the room around her, associated with nausea and at one time even was vomiting.  Diarrhea was noticed as well.  The patient did have frequently headaches in conjunction with a vertigo spell.  She has headaches without vertigo as well but vertigo appeared to be connected to the migrainous headaches.   I would order a transcranial Doppler study for the patient to make sure that her vertebrobasilar flow is unimpaired, and I will send her for vestibular rehab. I would like for her to have low dose xanax for emergency.  I would like Dr Drema Dallas to address her HTN- today very high. Goal is 130-80 mmhg. White mater changes in her brain can be related.   parasitic dysesthesias have completely resolved, and she does feel more alert and more oriented to her surroundings.  Her hearing loss is severe and bilaterally present and not fully compensated for by hearing aids.   I believe that this is the origin of her perceived cognitive impairment.  She has felt better since using Aricept and for this reason I will continue the medication for the next 12 month and see her in one year. MOCA next visit with MMSE.    Continue Aricept at 10 mg will refill for 12 months Memory score is stable- MOCA 20-30.  Follow-up in 12 months with me ,  Dr. Stefan Church, MD  [facility] [address], [place], [place] [address] [phone] An

## 2017-08-03 NOTE — Patient Instructions (Signed)
Alprazolam tablets What is this medicine? ALPRAZOLAM (al PRAY zoe lam) is a benzodiazepine. It is used to treat anxiety and panic attacks, but in your case to treat vertigo. This medicine may be used for other purposes; ask your health care provider or pharmacist if you have questions. COMMON BRAND NAME(S): Xanax What should I tell my health care provider before I take this medicine?How should I use this medicine? Take this medicine by mouth with a glass of water. Follow the directions on the prescription label. Take your medicine at regular intervals. Do not take it more often than directed. Do not stop taking except on your doctor's advice. A special MedGuide will be given to you by the pharmacist with each prescription and refill. Be sure to read this information carefully each time. Talk to your pediatrician regarding the use of this medicine in children. Special care may be needed. Overdosage: If you think you have taken too much of this medicine contact a poison control center or emergency room at once. NOTE: This medicine is only for you. Do not share this medicine with others. What if I miss a dose? If you miss a dose, take it as soon as you can. If it is almost time for your next dose, take only that dose. Do not take double or extra doses. What may interact with this medicine? Do not take this medicine with any of the following medications: -certain antiviral medicines for HIV or AIDS like delavirdine, indinavir -certain medicines for fungal infections like ketoconazole and itraconazole -narcotic medicines for cough -sodium oxybate This medicine may also interact with the following medications: -alcohol -antihistamines for allergy, cough and cold -certain antibiotics like clarithromycin, erythromycin, isoniazid, rifampin, rifapentine, rifabutin, and troleandomycin -certain medicines for blood pressure, heart disease, irregular heart beat -certain medicines for depression, like  amitriptyline, fluoxetine, sertraline -certain medicines for seizures like carbamazepine, oxcarbazepine, phenobarbital, phenytoin, primidone -cimetidine -cyclosporine -female hormones, like estrogens or progestins and birth control pills, patches, rings, or injections -general anesthetics like halothane, isoflurane, methoxyflurane, propofol -grapefruit juice -local anesthetics like lidocaine, pramoxine, tetracaine -medicines that relax muscles for surgery -narcotic medicines for pain -other antiviral medicines for HIV or AIDS -phenothiazines like chlorpromazine, mesoridazine, prochlorperazine, thioridazine This list may not describe all possible interactions. Give your health care provider a list of all the medicines, herbs, non-prescription drugs, or dietary supplements you use. Also tell them if you smoke, drink alcohol, or use illegal drugs. Some items may interact with your medicine. What should I watch for while using this medicine? Tell your doctor or health care professional if your symptoms do not start to get better or if they get worse. Do not stop taking except on your doctor's advice. You may develop a severe reaction. Your doctor will tell you how much medicine to take. You may get drowsy or dizzy. Do not drive, use machinery, or do anything that needs mental alertness until you know how this medicine affects you. To reduce the risk of dizzy and fainting spells, do not stand or sit up quickly, especially if you are an older patient. Alcohol may increase dizziness and drowsiness. Avoid alcoholic drinks. If you are taking another medicine that also causes drowsiness, you may have more side effects. Give your health care provider a list of all medicines you use. Your doctor will tell you how much medicine to take. Do not take more medicine than directed. Call emergency for help if you have problems breathing or unusual sleepiness. What side effects may I  notice from receiving this  medicine? Side effects that you should report to your doctor or health care professional as soon as possible: -allergic reactions like skin rash, itching or hives, swelling of the face, lips, or tongue -breathing problems -confusion -loss of balance or coordination -signs and symptoms of low blood pressure like dizziness; feeling faint or lightheaded, falls; unusually weak or tired -suicidal thoughts or other mood changes Side effects that usually do not require medical attention (report to your doctor or health care professional if they continue or are bothersome): -dizziness -dry mouth -nausea, vomiting -tiredness This list may not describe all possible side effects. Call your doctor for medical advice about side effects. You may report side effects to FDA at 1-800-FDA-1088. Where should I keep my medicine? Keep out of the reach of children. This medicine can be abused. Keep your medicine in a safe place to protect it from theft. Do not share this medicine with anyone. Selling or giving away this medicine is dangerous and against the law. Store at room temperature between 20 and 25 degrees C (68 and 77 degrees F). This medicine may cause accidental overdose and death if taken by other adults, children, or pets. Mix any unused medicine with a substance like cat litter or coffee grounds. Then throw the medicine away in a sealed container like a sealed bag or a coffee can with a lid. Do not use the medicine after the expiration date. NOTE: This sheet is a summary. It may not cover all possible information. If you have questions about this medicine, talk to your doctor, pharmacist, or health care provider.  2018 Elsevier/Gold Standard (2015-05-01 13:47:25) Vertigo Vertigo means that you feel like you are moving when you are not. Vertigo can also make you feel like things around you are moving when they are not. This feeling can come and go at any time. Vertigo often goes away on its own. Follow  these instructions at home:  Avoid making fast movements.  Avoid driving.  Avoid using heavy machinery.  Avoid doing any task or activity that might cause danger to you or other people if you would have a vertigo attack while you are doing it.  Sit down right away if you feel dizzy or have trouble with your balance.  Take over-the-counter and prescription medicines only as told by your doctor.  Follow instructions from your doctor about which positions or movements you should avoid.  Drink enough fluid to keep your pee (urine) clear or pale yellow.  Keep all follow-up visits as told by your doctor. This is important. Contact a doctor if:  Medicine does not help your vertigo.  You have a fever.  Your problems get worse or you have new symptoms.  Your family or friends see changes in your behavior.  You feel sick to your stomach (nauseous) or you throw up (vomit).  You have a "pins and needles" feeling or you are numb in part of your body. Get help right away if:  You have trouble moving or talking.  You are always dizzy.  You pass out (faint).  You get very bad headaches.  You feel weak or have trouble using your hands, arms, or legs.  You have changes in your hearing.  You have changes in your seeing (vision).  You get a stiff neck.  Bright light starts to bother you. This information is not intended to replace advice given to you by your health care provider. Make sure you discuss any questions  you have with your health care provider. Document Released: 05/11/2008 Document Revised: 01/08/2016 Document Reviewed: 11/25/2014 Elsevier Interactive Patient Education  [employer].

## 2017-08-03 NOTE — Addendum Note (Signed)
Addended by: Larey Seat on: 08/03/2017 11:22 AM   Modules accepted: Orders

## 2017-08-17 ENCOUNTER — Ambulatory Visit (HOSPITAL_COMMUNITY)
Admission: RE | Admit: 2017-08-17 | Discharge: 2017-08-17 | Disposition: A | Discharge status: Home / Self Care | Payer: Medicare Other | Source: Ambulatory Visit | Attending: Neurology | Admitting: Neurology

## 2017-08-17 DIAGNOSIS — R930 Abnormal findings on diagnostic imaging of skull and head, not elsewhere classified: Secondary | ICD-10-CM | POA: Diagnosis not present

## 2017-08-17 DIAGNOSIS — H8309 Labyrinthitis, unspecified ear: Secondary | ICD-10-CM | POA: Diagnosis not present

## 2017-08-17 DIAGNOSIS — I679 Cerebrovascular disease, unspecified: Secondary | ICD-10-CM | POA: Insufficient documentation

## 2017-08-17 DIAGNOSIS — I6789 Other cerebrovascular disease: Secondary | ICD-10-CM

## 2017-08-17 DIAGNOSIS — G43011 Migraine without aura, intractable, with status migrainosus: Secondary | ICD-10-CM | POA: Diagnosis not present

## 2017-08-17 DIAGNOSIS — H81319 Aural vertigo, unspecified ear: Secondary | ICD-10-CM | POA: Diagnosis not present

## 2017-08-17 NOTE — Progress Notes (Signed)
Vascular Ultrasound Transcranial Doppler has been completed.    08/17/2017 2:29 PM Maudry Mayhew, BS, RVT, RDCS, RDMS

## 2017-08-22 ENCOUNTER — Telehealth: Payer: Self-pay | Admitting: Neurology

## 2017-08-22 NOTE — Telephone Encounter (Signed)
Called to make the patient aware that there was normal flow in her Transcranial dopplers. No answer. LVM for the patient to return.  If pt returns call please make her aware that the test was normal and Dr Brett Fairy didn't have anything else to add.

## 2017-08-22 NOTE — Telephone Encounter (Signed)
-----   Message from Larey Seat, MD sent at 08/22/2017  8:24 AM EST ----- Normal flow of intracranial vessels. CD

## 2017-08-23 NOTE — Telephone Encounter (Signed)
Pt returned call and is aware that her test was normal and Dr. Brett Fairy has nothing to add.

## 2017-08-25 ENCOUNTER — Ambulatory Visit: Payer: Medicare Other | Attending: Neurology | Admitting: Rehabilitative and Restorative Service Providers"

## 2017-08-25 DIAGNOSIS — R2681 Unsteadiness on feet: Secondary | ICD-10-CM | POA: Insufficient documentation

## 2017-08-25 DIAGNOSIS — R42 Dizziness and giddiness: Secondary | ICD-10-CM | POA: Insufficient documentation

## 2017-08-25 NOTE — Patient Instructions (Signed)
Gaze Stabilization - Tip Card  1.Target must remain in focus, not blurry, and appear stationary while head is in motion. 2.Perform exercises with small head movements (45 to either side of midline). 3.Increase speed of head motion so long as target is in focus. 4.If you wear eyeglasses, be sure you can see target through lens (therapist will give specific instructions for bifocal / progressive lenses). 5.These exercises may provoke dizziness or nausea. Work through these symptoms. If too dizzy, slow head movement slightly. Rest between each exercise. 6.Exercises demand concentration; avoid distractions. 7.For safety, perform standing exercises close to a counter, wall, corner, or next to someone.  Copyright  VHI. All rights reserved.   Gaze Stabilization - Standing Feet Apart   Feet shoulder width apart, keeping eyes on target on wall 3 feet away, tilt head down slightly and move head side to side for 30 seconds. Repeat while moving head up and down for 30 seconds. *Work up to tolerating 60 seconds, as able. Do 2-3 sessions per day.   Copyright  VHI. All rights reserved.   Feet Partial Heel-Toe, Varied Arm Positions - Eyes Open    With eyes open, right foot partially in front of the other, arms at your side, look straight ahead at a stationary object. Hold _30___ seconds, then switch feet. Repeat _3___ times per session. Do _2___ sessions per day.  Copyright  VHI. All rights reserved.   Feet Together, Varied Arm Positions - Eyes Closed    Stand with feet together and arms at your side.  Close eyes and visualize upright position. Hold __30__ seconds. Repeat __3__ times per session. Do __2__ sessions per day.  Copyright  VHI. All rights reserved.   SINGLE LIMB STANCE    Stance: single leg on floor. Raise leg. Hold _10__ seconds. Repeat with other leg. __3_ reps per set, __2_ sets per day.  Copyright  VHI. All rights reserved.

## 2017-08-26 NOTE — Therapy (Signed)
[facility] [address] [place], [place], [address] Phone: [phone]   Fax:  [phone]  Physical Therapy Treatment  Patient Details  Name: Brittney [name] MRN: [id number] Date of Birth: 12-20-1934 Referring Provider: Larey Seat MD   Encounter Date: 08/25/2017  PT End of Session - 08/26/17 1056    Visit Number  1    Number of Visits  4    Date for PT Re-Evaluation  09/25/17    Authorization Type  medicare    PT Start Time  1110    PT Stop Time  1150    PT Time Calculation (min)  40 min    Activity Tolerance  Patient tolerated treatment well    Behavior During Therapy  [facility] for tasks assessed/performed       Past Medical History:  Diagnosis Date  . CKD (chronic kidney disease), stage III ([place])   . Familial hypercholesterolemia   . GERD (gastroesophageal reflux disease)   . Glaucoma   . Hypothyroid   . Migraine headache   . Post herpetic neuralgia     Past Surgical History:  Procedure Laterality Date  . ANAL FISSURE REPAIR    . APPENDECTOMY    . BASAL CELL CARCINOMA EXCISION    . cataract surgery     left  . CHOLECYSTECTOMY    . hemithyroidectomy    . INCONTINENCE SURGERY    . THYROIDECTOMY    . TUBAL LIGATION    . VAGINAL HYSTERECTOMY    . vaginal vault repair      There were no vitals filed for this visit.  Subjective Assessment - 08/25/17 1113    Subjective  The patient notes sudden onset of dizziness in August after waking with a migraine.  She described a sensation of room spinning that lasted hours (from 4am-7am).  She states HA lasted x 3 days.  She had intermittent spells of dizziness combined with headaches.  She reports balance is off and she just improved around 06/2017.  She has not had a dizzy spell in awhile.  She does note occasional sensation that are sporadic like dizziness could start.      Pertinent History  Migraines since 83 yo but no h/o vertigo.      Patient Stated  Goals  "I want to get well".      Currently in Pain?  No/denies         [facility] PT Assessment - 08/25/17 1117      Assessment   Medical Diagnosis  Vertigo    Referring Provider  Dohmeier, Asencion Partridge MD    Onset Date/Surgical Date  -- 03/2017    Prior Therapy  none      Precautions   Precautions  None      Restrictions   Weight Bearing Restrictions  No      Balance Screen   Has the patient fallen in the past 6 months  No    Has the patient had a decrease in activity level because of a fear of falling?   Yes due to dizziness    Is the patient reluctant to leave their home because of a fear of falling?   No      Home Environment   Living Environment  Private residence    Living Arrangements  Spouse/significant other    Type of [place] Access  Level entry    [place]  None      Prior Function   Level of Independence  Independent      Ambulation/Gait   Ambulation/Gait  Yes    Ambulation/Gait Assistance  7: Independent    Ambulation Distance (Feet)  400 Feet    Assistive device  None    Gait Pattern  Within Functional Limits    Ambulation Surface  Level;Indoor    Gait velocity  3.74 ft/sec    Stairs  Yes    Stairs Assistance  7: Independent    Stair Management Technique  No rails;Alternating pattern    Number of Stairs  4    Gait Comments  Also performed dynamic gait activities       Standardized Balance Assessment   Standardized Balance Assessment  Berg Balance Test      Berg Balance Test   Sit to Stand  Able to stand without using hands and stabilize independently    Standing Unsupported  Able to stand safely 2 minutes    Sitting with Back Unsupported but Feet Supported on Floor or Stool  Able to sit safely and securely 2 minutes    Stand to Sit  Sits safely with minimal use of hands    Transfers  Able to transfer safely, minor use of hands    Standing Unsupported with Eyes Closed  Able to stand 10 seconds safely    Standing  Ubsupported with Feet Together  Able to place feet together independently and stand 1 minute safely    From Standing, Reach Forward with Outstretched Arm  Can reach confidently >25 cm (10")    From Standing Position, Pick up Object from Floor  Able to pick up shoe safely and easily    From Standing Position, Turn to Look Behind Over each Shoulder  Looks behind one side only/other side shows less weight shift    Turn 360 Degrees  Able to turn 360 degrees safely but slowly    Standing Unsupported, Alternately Place Feet on Step/Stool  Able to stand independently and safely and complete 8 steps in 20 seconds    Standing Unsupported, One Foot in Front  Able to plae foot ahead of the other independently and hold 30 seconds    Standing on One Leg  Tries to lift leg/unable to hold 3 seconds but remains standing independently    Total Score  49    Berg comment:  49/56         Vestibular Assessment - 08/25/17 1119      Vestibular Assessment   General Observation  Patient ambulates into clinic indep without a device.  During prior "spells" of vertigo, patient notes losing control of her body.      Symptom Behavior   Type of Dizziness  Imbalance spinning at onset with nausea/vomitting    Frequency of Dizziness  intermittent    Duration of Dizziness  hours    Aggravating Factors  Activity in general spontaneous, movement aggravates    Relieving Factors  Head stationary;Lying supine at times, can continue with lying down       Occulomotor Exam   Occulomotor Alignment  Normal    Spontaneous  Absent    Gaze-induced  Absent    Smooth Pursuits  Intact    Saccades  Intact      Vestibulo-Occular Reflex   VOR 1 Head Only (x 1 viewing)  slow VOR hard to coordinate    Comment  Head impulse test=noted a mild corrective saccade with bilateral HiT.  Positional Testing   Dix-Hallpike  Dix-Hallpike Right;Dix-Hallpike Left    Sidelying Test  Sidelying Right;Sidelying Left    Horizontal Canal  Testing  Horizontal Canal Right;Horizontal Canal Left      Dix-Hallpike Right   Dix-Hallpike Right Duration  0    Dix-Hallpike Right Symptoms  No nystagmus      Dix-Hallpike Left   Dix-Hallpike Left Duration  0    Dix-Hallpike Left Symptoms  No nystagmus      Sidelying Right   Sidelying Right Duration  0    Sidelying Right Symptoms  No nystagmus      Sidelying Left   Sidelying Left Duration  0    Sidelying Left Symptoms  No nystagmus      Horizontal Canal Right   Horizontal Canal Right Duration  0    Horizontal Canal Right Symptoms  Normal      Horizontal Canal Left   Horizontal Canal Left Duration  0    Horizontal Canal Left Symptoms  Normal      Positional Sensitivities   Head Turning x 5  No dizziness    Head Nodding x 5  No dizziness                      PT Education - 08/26/17 1055    Education provided  Yes    Education Details  HEP: gaze adaptation, high level balance activities.    Person(s) Educated  Patient    Methods  Explanation    Comprehension  Verbalized understanding          PT Long Term Goals - 08/26/17 1058      PT LONG TERM GOAL #1   Title  The patient will return demo HEP for gaze adaptation, high level balance.    Time  4    Period  Weeks    Target Date  09/25/17      PT LONG TERM GOAL #2   Title  Further assess vertigo if any further recurrence or worsening of symtpoms occurs    Time  4    Period  Weeks    Target Date  09/25/17            Plan - 08/26/17 1101    Clinical Impression Statement  The patient is an 83 yo female presenting to OP therapy with h/o migraines, onset of vertigo in 03/2017 that has significantly improved, occasional imbalance noted.  She presents today noting an improvement in her balance since around Thanksgiving.  She scores 49/56 indicating low fall risk category.  She did have mild refixation saccade with bilateral head impulse testing indicating diminished VOR.  PT established HEP  including gaze x 1 and high level balance.      Clinical Presentation  Stable    Clinical Decision Making  Low    Rehab Potential  Good    PT Frequency  1x / week    PT Duration  4 weeks    PT Treatment/Interventions  ADLs/Self Care Home Management;Canalith Repostioning;Vestibular;Therapeutic exercise;Therapeutic activities;Balance training;Neuromuscular re-education;Gait training;Functional mobility training;Patient/family education    PT Next Visit Plan  Check HEP, reassess vertigo as needed.    Consulted and Agree with Plan of Care  Patient       Patient will benefit from skilled therapeutic intervention in order to improve the following deficits and impairments:  Dizziness, Decreased balance  Visit Diagnosis: Dizziness and giddiness  Unsteadiness on feet     Problem List Patient Active Problem  List   Diagnosis Date Noted  . Intractable migraine without aura and with status migrainosus 08/03/2017  . Labyrinthine vestibulitis, unspecified laterality 08/03/2017  . Vertigo, aural, unspecified laterality 08/03/2017  . Cerebral microvascular disease 08/03/2017  . Palpitations 12/14/2016  . Amnestic MCI (mild cognitive impairment with memory loss) 07/22/2016  . Sensorineural hearing loss (SNHL) of both ears 07/22/2016  . Memory loss 07/15/2015  . 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 Pancreatic cyst 05/26/2014  . Hypothyroid   . GERD (gastroesophageal reflux disease)   . Migraine headache   . CKD (chronic kidney disease), stage III ([place])   . Diverticulitis 05/25/2014  . Dyspnea 01/03/2014  . Heart murmur 01/03/2014  . Familial hypercholesterolemia 01/03/2014  . Pseudophakia of left eye 05/29/2012  . Personal history of other infectious and parasitic diseases 05/17/2012  . History of shingles 05/17/2012  . Hyperlipidemia, unspecified 05/17/2012  . Latex allergy 05/17/2012  . Thyroid nodule 05/17/2012  . Cataract cortical, senile 04/04/2012  . Cortical senile  cataract 04/04/2012  . Open-angle glaucoma 04/04/2012    Samuella Rasool, PT 08/26/2017, 11:06 AM  [facility] [address] [place], [place], [address] Phone: [phone]   Fax:  [phone]  Name: MYLEAH CAVENDISH MRN: [id number] Date of Birth: 08/06/1935

## 2017-09-08 ENCOUNTER — Ambulatory Visit: Payer: Medicare Other | Admitting: Rehabilitative and Restorative Service Providers"

## 2017-09-08 DIAGNOSIS — R42 Dizziness and giddiness: Secondary | ICD-10-CM | POA: Diagnosis not present

## 2017-09-08 DIAGNOSIS — R2681 Unsteadiness on feet: Secondary | ICD-10-CM | POA: Diagnosis not present

## 2017-09-08 NOTE — Therapy (Signed)
[facility] [address] [place], [place], [address] Phone: [phone]   Fax:  [phone]  Physical Therapy Treatment and Discharge Summary  Patient Details  Name: Brittney [name] MRN: [id number] Date of Birth: 1934/12/22 Referring Provider: Larey Seat MD   Encounter Date: 09/08/2017  PT End of Session - 09/08/17 1043    Visit Number  2    Number of Visits  4    Date for PT Re-Evaluation  09/25/17    Authorization Type  medicare    Activity Tolerance  Patient tolerated treatment well    Behavior During Therapy  [facility] for tasks assessed/performed       Past Medical History:  Diagnosis Date  . CKD (chronic kidney disease), stage III ([place])   . Familial hypercholesterolemia   . GERD (gastroesophageal reflux disease)   . Glaucoma   . Hypothyroid   . Migraine headache   . Post herpetic neuralgia     Past Surgical History:  Procedure Laterality Date  . ANAL FISSURE REPAIR    . APPENDECTOMY    . BASAL CELL CARCINOMA EXCISION    . cataract surgery     left  . CHOLECYSTECTOMY    . hemithyroidectomy    . INCONTINENCE SURGERY    . THYROIDECTOMY    . TUBAL LIGATION    . VAGINAL HYSTERECTOMY    . vaginal vault repair      There were no vitals filed for this visit.  Subjective Assessment - 09/08/17 1024    Subjective  The patient notes balance exercises are making her shoulders sore (arms out to the side).    She reports she has returned to prior level of functioning and does not feel limited by imbalance or dizziness.     Patient Stated Goals  "I want to get well".                        [facility] PT Treatment/Exercise - 09/08/17 1044      Neuro Re-ed    Neuro Re-ed Details   The patient reviewed home program including partial heel toe/ single limb stance/ feet together + eyes closed/ and VOR x 1 viewing with education to wear eye lenses (glasses ) to perform.                     PT Long Term Goals - 09/08/17 1025      PT LONG TERM GOAL #1   Title  The patient will return demo HEP for gaze adaptation, high level balance.    Time  4    Period  Weeks    Status  Achieved      PT LONG TERM GOAL #2   Title  Further assess vertigo if any further recurrence or worsening of symtpoms occurs    Time  4    Period  Weeks    Status  Achieved            Plan - 09/08/17 1046    Clinical Impression Statement  The patient met LTGs and feels back at her baseline level of function.  PT recommended continuation of home exercises x 3-4 more weeks for VOR adaptation.     PT Treatment/Interventions  ADLs/Self Care Home Management;Canalith Repostioning;Vestibular;Therapeutic exercise;Therapeutic activities;Balance training;Neuromuscular re-education;Gait training;Functional mobility training;Patient/family education    PT Next Visit Plan  Discharge today.    Consulted and Agree with Plan of Care  Patient  Patient will benefit from skilled therapeutic intervention in order to improve the following deficits and impairments:  Dizziness, Decreased balance  Visit Diagnosis: Dizziness and giddiness  Unsteadiness on feet  PHYSICAL THERAPY DISCHARGE SUMMARY  Visits from Start of Care: 2  Current functional level related to goals / functional outcomes: See above   Remaining deficits: None per report   Education / Equipment: Home exercise program.  Plan: Patient agrees to discharge.  Patient goals were met. Patient is being discharged due to meeting the stated rehab goals.  ?????        Thank you for the referral of this patient. Rudell Cobb, MPT     [place], PT 09/08/2017, 10:56 AM  [facility] [address] [place], [place], [address] Phone: [phone]   Fax:  [phone]  Name: Brittney [name] MRN: [id number] Date of Birth: 1935/04/25

## 2017-09-20 DIAGNOSIS — R350 Frequency of micturition: Secondary | ICD-10-CM | POA: Diagnosis not present

## 2017-09-20 DIAGNOSIS — N760 Acute vaginitis: Secondary | ICD-10-CM | POA: Diagnosis not present

## 2017-09-20 DIAGNOSIS — R102 Pelvic and perineal pain: Secondary | ICD-10-CM | POA: Diagnosis not present

## 2017-09-22 DIAGNOSIS — I10 Essential (primary) hypertension: Secondary | ICD-10-CM | POA: Diagnosis not present

## 2017-10-12 DIAGNOSIS — R102 Pelvic and perineal pain: Secondary | ICD-10-CM | POA: Diagnosis not present

## 2017-10-16 DIAGNOSIS — J069 Acute upper respiratory infection, unspecified: Secondary | ICD-10-CM | POA: Diagnosis not present

## 2017-11-03 ENCOUNTER — Ambulatory Visit (INDEPENDENT_AMBULATORY_CARE_PROVIDER_SITE_OTHER): Payer: Medicare Other | Admitting: Neurology

## 2017-11-03 ENCOUNTER — Encounter: Payer: Self-pay | Admitting: Neurology

## 2017-11-03 VITALS — BP 148/73 | HR 62 | Ht 61.0 in | Wt 160.0 lb

## 2017-11-03 DIAGNOSIS — G3184 Mild cognitive impairment, so stated: Secondary | ICD-10-CM

## 2017-11-03 MED ORDER — DONEPEZIL HCL 10 MG PO TABS: 10.0000 mg | ORAL_TABLET | Freq: Every day | ORAL | 3 refills | Status: AC

## 2017-11-03 NOTE — Patient Instructions (Signed)
Mild Neurocognitive Disorder Mild neurocognitive disorder (formerly known as mild cognitive impairment) is a mental disorder. It is a slight abnormal decrease in mental function. The areas of mental function affected may include memory, thought, communication, behavior, and completion of tasks. The decrease is noticeable and measurable but for the most part does not interfere with your daily activities. Mild neurocognitive disorder typically occurs in people older than 60 years but can occur earlier. It is not as serious as major neurocognitive disorder (formerly known as dementia) but may lead to a more serious neurocognitive disorder. However, in some cases the condition does not get worse. A few people with this disorder even improve. What are the causes? There are a number of different causes of mild neurocognitive disorder:  Brain disorders associated with abnormal protein deposits, such as Alzheimer's disease, Pick's disease, and Lewy body disease.  Brain disorders associated with abnormal movement, such as Parkinson's disease and Huntington's disease.  Diseases affecting blood vessels in the brain and resulting in mini-strokes.  Certain infections, such as human immunodeficiency virus (HIV) infection.  Traumatic brain injury.  Other medical conditions such as brain tumors, underactive thyroid (hypothyroidism), and vitamin B12 deficiency.  Use of certain prescription medicine and "recreational" drugs.  What are the signs or symptoms? Symptoms of mild neurocognitive disorder include:  Difficulty remembering. You may forget details of recent events, names, or phone numbers. You may forget important social events and appointments or repeatedly forget where you put your car keys.  Difficulty thinking and solving problems. You may have trouble with complex tasks such as paying bills or driving in unfamiliar locations.  Difficulty communicating. You may have trouble finding the right word,  naming an object, forming a sentence that makes sense, or understanding what you read or hear.  Changes in your behavior or personality. You may lose interest in the things that you used to enjoy or withdraw from social situations. You may get angry more easily than usual. You may act before thinking. You may do things in public that you would not usually do. You may hear or see things that are not real (hallucinations). You may believe falsely that others are trying to hurt you (paranoia).  How is this diagnosed? Mild neurocognitive disorder is diagnosed through an assessment by your health care provider. Your health care provider will ask you and your family, friends, or coworkers questions about your symptoms. He or she will ask how often the symptoms occur, how long they have been occurring, whether they are getting worse, and the effect they are having on your life. Your health care provider may refer you to a neurologist or mental health specialist for a detailed evaluation of your mental functions (neuropsychological testing). To identify the cause of your mild neurocognitive disorder, your health care provider may:  Obtain a detailed medical history.  Ask about alcohol and drug use, including prescription medicine.  Perform a physical exam.  Order blood tests and brain imaging exams.  How is this treated? Mild neurocognitive disorder caused by infections, use of certain medicines or "recreational" drugs, and certain medical conditions may improve with treatment of the condition that is causing the disorder. Mild neurocognitive disorder resulting from other causes generally does not improve and may worsen. In these cases, the goal of treatment is to slow progression of the disorder and help you cope with the loss of mental function. Treatments in these cases include:  Medicine. Medicine helps mainly with memory loss and behavioral symptoms.  Talk therapy.   Talk therapy provides education,  emotional support, memory aids, and other ways of making up for decreases in mental function.  Lifestyle changes. These include regular exercise, a healthy diet (including essential omega-3 fatty acids), intellectual stimulation, and increased social interaction.  This information is not intended to replace advice given to you by your health care provider. Make sure you discuss any questions you have with your health care provider. Document Released: 04/04/2013 Document Revised: 01/08/2016 Document Reviewed: 12/25/2012 Elsevier Interactive Patient Education  2017 Elsevier Inc.  

## 2017-11-03 NOTE — Progress Notes (Signed)
[facility]  PATIENT: Brittney [name] DOB: 03-23-1935   REASON FOR VISIT: Follow-up for dementia, pseudo parasitic dysesthesias HISTORY FROM: Patient and husband   HISTORY OF PRESENT ILLNESS:  06/17/15 CD Brittney [name] is a 83 y.o. female , seen here as a referral/ revisit from Dr. Wilhemina Bonito for pseudoparasitic sensory abnormalities, psychosis.  Chief complaint according to patient : " The patient feels that small lesions are developing all over her skin including at the root of the eyelashes and upper lip"  She reports a sensation of movement under her skin. She does not feel that these are nocturnal or daytime active sensations but that they occur any time and that there perpetually there. She has tried with Q-tips to manipulate to get to the bottom of the skin lesions but has actually created some skin lesions in the process.  She had seen Dr. Wilhemina Bonito at [facility] dermatology and endorsed symptoms of skin irritation little, bumps -changing in size, Itchiness,tenderness, sometimes soreness but also a creepy crawly sensation under the skin. This does not affect one region of the body alone but includes arms lower extremities trunk and face. His causes the patient discomfort and also has affected her sleep and actually consumes a significant time of her thoughts and daytime. Her husband agrees.  In addition Dr. Ronnald Ramp eluded to some cognitive difficulties she may have developed. He was concerned about  beginning memory loss disorder. Brittney [name] underwent 2 tests here today a Mini-Mental Status examination as well as a Montral cognitive assessment test. On the Mini-Mental Status Examination she missed 5 out of 5 points on attention and calculation and could not recall any of the 3 recall words.. I would have given her 22 out of 30 points for this performance. The Montral cognitive assessment test is much more difficult she was not able to solve the trail  making test or draw a three-dimensional cube. She placed the contour and numbers into a clock face but not the hands. She was able to name 3 out of 3 animals was able to repeat a series of digits but had trouble with his serial 7 again. She was able to abstract, her word fluency was good with 11 F words, and she scored 6 out of 6 points on orientation date and place. This is similar to her Mini-Mental Status Examination.  It is unusual for patient to be fully oriented to date place and time and have primarily visual spatial difficulties and recall difficulties.  It is clear visit short-term memory is affected but thus it should affect date and time as well. The patient is aware of her cognitive deficits and day-to-day life. The couple recently stayed in a hotel and the patient was not sure which direction to go to the elevator for example. She has not gotten lost outside. The patient prepares about 1 warm meal per week, she states she now wears gloves and a Cap , is afraid to contaminate the food. She has never balanced the chequebook, she only worked 10 years of her life outside the home. She worked for [employer].   The patient is the oldest of 5 siblings and as far she knows not no other sibling has difficulties with cognition or memory. Her parents were not affected by dementia. She has 1 maternal first cousin who suffers from Alzheimer's disease.  UPDATE11/29/16 Brittney [name], 83 year old female returns for follow-up. She was initially evaluated on 06/17/2015 by Dr. Brett Fairy for memory loss and  pseudo-parasitic sensory abnormalities. She continues to report a sensation of movement under her skin at times. She feels that her memory is better . MRI of the brain performed 07/06/2015 without acute findings but nonspecific chronic microvascular ischemic changes. Thyroid and B12 levels were normal She was placed on Seroquel and Aricept. She returns for follow-up.  UPDATE 01/15/2016 CM Brittney [name], 83 year old  female returns for follow-up. She has a history of memory loss and is currently on Aricept 10 mg at bedtime however she reports vivid dreaming and she was asked to take in the morning. She continues to have occasional migraine is followed by her primary care and she takes Fioricet. Her memory score stable today. She tries to stay active. She returns for reevaluation with her husband.  Interval history from 07/22/2016, I have the pleasure of seeing Brittney [name] with her husband today she is meanwhile 64 years old and returned for a memory test. A Mini-Mental Status Examination was performed and she scored 29 out of 30 points again. She continues to take Aricept and feels that it has helped her. By today score she would not be considered having a memory loss disorder but a possible mild cognitive impairment, which I attributed to her hearing loss.  History from 03 August 2017.  I have the pleasure of seeing Brittney [name] again today for a yearly visit following her memory loss.  This year we performed a Montreal cognitive assessment and the patient scored 20 out of 30 points.  She had some difficulties with visual spatial recognition, she did not have difficulties with naming objects, repeating and repeating numbers.  She did very well with serial subtraction of sevens, she was able to abstract and she recalled 3 out of 5 immediate recall words.  She is fully oriented to place and time. The patient would like to be evaluated for migrainous headaches today, but also she is an established patient this will be a new problem.  The patient reports that her migraines started acutely on August 26 while on vacation she woke up in the early morning hours with a severe headache on both sides of her head, as she set up on her bedside she developed vertigo with the room was spinning rapidly around her, clockwise,  she felt very ill. Even when she got back to bed and laid down did the vertigo persist. She felt nauseated  and weak, and off balance. It resolved after 1 day, but some vertigo was still present.  2 days later she had another migraine with vertigo,this one  lasting 3 days.   On Nov 7th she was in bed, got dizzy and violently vomited, she also developed diarrhea.  On Nov 9th migraine again, with dizziness.  Nov 27th migraine , no dizziness.   Interval history from 03 November 2017, I have the pleasure of meeting with Mr. and Brittney [name] today, Brittney [name] having undergone a transcranial Doppler study on 17 August 2017.  There was a slight difference between her vertebral pulsatility depth left versus right, and on the left side the vertebral pulse was 1.4 mm versus 1.08 on the right this was the only concerning part and is still an mild elevation overall.  These pulsatility indices are likely due to atherosclerosis of the vessels.  Dr. Erlinda Hong  interpreted the study.  I also see Brittney [name] today to follow-up on subjective memory concerns.  She has no longer vertigo or migraine ! Her memory was tested today, and she has MCI,  may be beginning dementia.     Montreal Cognitive Assessment  11/03/2017 08/03/2017 07/15/2015 06/17/2015  Visuospatial/ Executive (0/5) 2 2 4 2   Naming (0/3) 2 2 3 3   Attention: Read list of digits (0/2) 1 2 2 1   Attention: Read list of letters (0/1) 1 1 0 0  Attention: Serial 7 subtraction starting at 100 (0/3) 3 3 3  0  Language: Repeat phrase (0/2) 1 1 2 2   Language : Fluency (0/1) 0 0 0 1  Abstraction (0/2) 2 1 2 2   Delayed Recall (0/5) 3 3 3  0  Orientation (0/6) 6 5 5 6   Total 21 20 24 17   Adjusted Score (based on education) - - - 18      REVIEW OF SYSTEMS: Full 14 system review of systems performed and notable only for those listed, all others are neg:   Musculoskeletal: Joint pain IBS- diverticulitis,   Neurological: History of headaches, memory loss, hearing loss , vertigo resolved.      ALLERGIES: Allergies  Allergen Reactions  . Amitriptyline Other (See Comments)      Skewed vision  . Latex Other (See Comments)    Blisters skin  . Meperidine Other (See Comments)    Mental status changes  . Sulfa Antibiotics Other (See Comments)    Reaction unknown  . Vioxx [Rofecoxib] Other (See Comments)    Reaction unknown  . Augmentin [Amoxicillin-Pot Clavulanate] Rash and Other (See Comments)  . Lipitor [Atorvastatin] Other (See Comments)    myalgias  . Motrin [Ibuprofen] Swelling  . Rosuvastatin Other (See Comments)    Joint and muscle pain    HOME MEDICATIONS: Outpatient Medications Prior to Visit  Medication Sig Dispense Refill  . ALPRAZolam (XANAX) 0.5 MG tablet Take 1 tablet (0.5 mg total) by mouth as needed for anxiety. Take prn for Vertigo 30 tablet 0  . butalbital-acetaminophen-caffeine (FIORICET, ESGIC) 50-325-40 MG tablet take 1 tablet by mouth if needed for headache, no more than 1 a day  0  . donepezil (ARICEPT) 10 MG tablet Take 1 tablet (10 mg total) by mouth at bedtime. 90 tablet 3  . ezetimibe (ZETIA) 10 MG tablet Take 10 mg by mouth daily.    [name] gabapentin (NEURONTIN) 100 MG capsule Take 100 mg by mouth every other day.    . levothyroxine (SYNTHROID, LEVOTHROID) 75 MCG tablet Take 75 mcg by mouth daily before breakfast.    . timolol (TIMOPTIC) 0.25 % ophthalmic solution instill 1 drop into both eyes once daily  0  . tizanidine (ZANAFLEX) 2 MG capsule Take 2 mg by mouth 3 (three) times daily as needed for muscle spasms.    . bimatoprost (LUMIGAN) 0.03 % ophthalmic solution Place 1 drop into both eyes at bedtime.    [name] LUMIGAN 0.01 % SOLN INSTILL 1 GTT INTO OU NIGHTLY  6   No facility-administered medications prior to visit.     PAST MEDICAL HISTORY: Past Medical History:  Diagnosis Date  . CKD (chronic kidney disease), stage III ([place])   . Familial hypercholesterolemia   . GERD (gastroesophageal reflux disease)   . Glaucoma   . Hypothyroid   . Migraine headache   . Post herpetic neuralgia     PAST SURGICAL HISTORY: Past Surgical  History:  Procedure Laterality Date  . ANAL FISSURE REPAIR    . APPENDECTOMY    . BASAL CELL CARCINOMA EXCISION    . cataract surgery     left  . CHOLECYSTECTOMY    . hemithyroidectomy    .  INCONTINENCE SURGERY    . THYROIDECTOMY    . TUBAL LIGATION    . VAGINAL HYSTERECTOMY    . vaginal vault repair      FAMILY HISTORY: Family History  Problem Relation Age of Onset  . Heart disease Mother        chf  . Heart disease Sister        CABG    SOCIAL HISTORY: Social History   Socioeconomic History  . Marital status: Married    Spouse name: Nadara Mustard  . Number of children: 4  . Years of education: 20  . Highest education level: Not on file  Occupational History  . Not on file  Social Needs  . Financial resource strain: Not on file  . Food insecurity:    Worry: Not on file    Inability: Not on file  . Transportation needs:    Medical: Not on file    Non-medical: Not on file  Tobacco Use  . Smoking status: Never Smoker  . Smokeless tobacco: Never Used  Substance and Sexual Activity  . Alcohol use: Not on file  . Drug use: Not on file  . Sexual activity: Not on file  Lifestyle  . Physical activity:    Days per week: Not on file    Minutes per session: Not on file  . Stress: Not on file  Relationships  . Social connections:    Talks on phone: Not on file    Gets together: Not on file    Attends religious service: Not on file    Active member of club or organization: Not on file    Attends meetings of clubs or organizations: Not on file    Relationship status: Not on file  . Intimate partner violence:    Fear of current or ex partner: Not on file    Emotionally abused: Not on file    Physically abused: Not on file    Forced sexual activity: Not on file  Other Topics Concern  . Not on file  Social History Narrative  . Not on file     PHYSICAL EXAM  Vitals:   11/03/17 1021  BP: (!) 148/73  Pulse: 62  Weight: 160 lb (72.6 kg)  Height: 5\' 1"  (1.549 m)     Body mass index is 30.23 kg/m.   neck size 15. 5, and Mallampati 3.    Generalized: Well developed, in no acute distress, well groomed  Head: normocephalic and atraumatic,. Oropharynx benign, pale .  Neck: Supple, no carotid bruits.  Cardiac: Regular rate rhythm, no murmur - no carotid bruits.  Normal peripheral pulses, and no edema, rash or cyanosis.    Neurological examination  MMSE - Mini Mental State Exam 11/03/2017 07/22/2016 01/15/2016  Orientation to time 5 5 5   Orientation to Place 5 5 5   Registration 3 3 3   Attention/ Calculation 5 5 4   Recall 0 2 3  Language- name 2 objects 2 2 2   Language- repeat 1 1 1   Language- follow 3 step command 3 3 3   Language- read & follow direction 1 1 1   Write a sentence 1 1 1   Copy design 1 1 1   Copy design-comments - - difficult to see lines due to redrawing over lines  Total score 27 29 29    Progressive short term memory loss. Lost all 3 recall words/. Montreal Cognitive Assessment  11/03/2017 08/03/2017 07/15/2015 06/17/2015  Visuospatial/ Executive (0/5) 2 2 4 2   Naming (0/3) 2 2  3 3  Attention: Read list of digits (0/2) 1 2 2 1   Attention: Read list of letters (0/1) 1 1 0 0  Attention: Serial 7 subtraction starting at 100 (0/3) 3 3 3  0  Language: Repeat phrase (0/2) 1 1 2 2   Language : Fluency (0/1) 0 0 0 1  Abstraction (0/2) 2 1 2 2   Delayed Recall (0/5) 3 3 3  0  Orientation (0/6) 6 5 5 6   Total 21 20 24 17   Adjusted Score (based on education) - - - 18      Mentation: Alert oriented to time, place, history taking. Attention span and concentration appropriate. She explained that it is Wednesday, but couldn't come up with the day when tested. She doesn't balance the cheque book- never has. She has not gotten lost, she cooks and bakes without trouble to recall ingredients and recipes.   Remote memory intact. Today no visual spatial difficulties. She couldn't finish trail making test.   Follows all commands speech and  language fluent. Cranial nerve ;Pupils were equal round reactive to light, her  extraocular movements were full, visual field were full on confrontational test. Facial sensation  normal. Symmetric facial strength. Hearing impaired has hearing aids, bilaterally. Uvula and tongue move midline. head turning and shoulder shrug were normal and symmetric.Tongue protrusion into cheek strength was normal. Motor: normal bulk and tone, full strength, fine finger movements normal, no pronator drift. No focal weakness Sensory: normal and symmetric to light touch, pinprick, andvibration,  Coordination: finger-nose-finger intact  Reflexes:  2/2, symmetric  Gait and Station: Rising up from seated position without assistance, normal stance,moderate stride, good arm swing, smooth turning, able to perform tiptoe, and heel walking without difficulty.  Tandem gait is only mildly unsteady. She climbs steps without diffiullties.   DIAGNOSTIC DATA (LABS, IMAGING, TESTING)  MOCA and MMSE   Reviewed MRI brain 06-19-2017.  CLINICAL DATA:  Migraines.  EXAM: MRI HEAD WITHOUT CONTRAST  TECHNIQUE: Multiplanar, multiecho pulse sequences of the brain and surrounding structures were obtained without intravenous contrast.  COMPARISON:  07/06/2015  FINDINGS: Brain: There is no evidence of acute infarct, intracranial hemorrhage, mass, midline shift, or extra-axial fluid collection. The ventricles and sulci are normal for age. Patchy T2 hyperintensities scattered throughout the subcortical and deep cerebral white matter bilaterally and milder T2 hyperintensities in the brainstem are unchanged.  Vascular: Major intracranial vascular flow voids are preserved.  Skull and upper cervical spine: Unremarkable bone marrow signal.  Sinuses/Orbits: Left cataract extraction. Paranasal sinuses and mastoid air cells are clear.  Other: None.  IMPRESSION: 1. No acute intracranial abnormality. 2. Moderate cerebral  white matter disease, unchanged and nonspecific though most often seen with chronic small vessel ischemia.   Electronically Signed   By: Logan Bores M.D.   On: 06/06/2017 14:35  ASSESSMENT AND PLAN  83 y.o. year old female presents today to discuss her TCD results- normal for age, her mild progression in short term memory loss and resolution of tinnitus and vertigo and migraine.  Her parasitic dysesthesias have completely resolved, and she does feel more alert and more oriented to her surroundings.  Her hearing loss is severe and bilaterally present and not fully compensated for by hearing aids. I believe that this is the origin of her perceived cognitive impairment.  She has felt better since using Aricept and for this reason I will continue the medication for the next 12 month and see her in one year.   Continue Aricept at 10 mg  will refill for 12 months for MCI with amnestic spells,  Memory score : MMSE Follow-up in 12 months with me , Dr. Brett Fairy, or [facility]    Larey Seat, MD  [facility] [address] [place],  [address] [phone] An

## 2017-11-10 DIAGNOSIS — J302 Other seasonal allergic rhinitis: Secondary | ICD-10-CM | POA: Diagnosis not present

## 2017-11-10 DIAGNOSIS — R05 Cough: Secondary | ICD-10-CM | POA: Diagnosis not present

## 2017-11-15 DIAGNOSIS — H401131 Primary open-angle glaucoma, bilateral, mild stage: Secondary | ICD-10-CM | POA: Diagnosis not present

## 2017-11-15 DIAGNOSIS — H25011 Cortical age-related cataract, right eye: Secondary | ICD-10-CM | POA: Diagnosis not present

## 2017-12-06 DIAGNOSIS — J302 Other seasonal allergic rhinitis: Secondary | ICD-10-CM | POA: Diagnosis not present

## 2017-12-20 DIAGNOSIS — R351 Nocturia: Secondary | ICD-10-CM | POA: Diagnosis not present

## 2017-12-20 DIAGNOSIS — R300 Dysuria: Secondary | ICD-10-CM | POA: Diagnosis not present

## 2018-01-23 ENCOUNTER — Telehealth: Payer: Self-pay | Admitting: Neurology

## 2018-01-23 DIAGNOSIS — H81319 Aural vertigo, unspecified ear: Secondary | ICD-10-CM

## 2018-01-23 NOTE — Telephone Encounter (Signed)
I have contacted the patient she informed me that in march she finished up her therapy for the vertigo and everything was fine until these episode started happening again 3 weeks ago. She said that since the 31st when she passed out for brief moment she just states its a "odd feeling that is over her" and that "just feels off". Pt denies dizziness at this time and states that she is careful with her movements. Last MRI of head was completed in October 2018. I have informed her that I would run this by Dr Brett Fairy and see what suggestions she may have. Pt verbalized understanding and will wait to hear back from me.

## 2018-01-23 NOTE — Telephone Encounter (Signed)
Patient has had vertigo for about 3 weeks. On May 31 she said her head felt like it was spinning, passed out for a short time then she got alright but she still has a bad feeling in her head.. Please call and discuss.

## 2018-01-24 NOTE — Telephone Encounter (Signed)
Rule out bradycardic episodes-

## 2018-01-24 NOTE — Addendum Note (Signed)
Addended by: Darleen Crocker on: 01/24/2018 09:43 AM   Modules accepted: Orders

## 2018-01-24 NOTE — Telephone Encounter (Signed)
Called the pt and made her aware that I reviewed everything that was going on with her with Dr Brett Fairy. I have advised the pt that Dr Brett Fairy would recommend that the patient re start the vestibular therapy. She also worries that the aricept can cause bradycardia and in turn that can cause dizziness. She would like to recommend that the pt reach out to her PCP and see if a cardiac monitor would be beneficial to evaluate if the patient is having bradycardia that Is associated with the spells she is having or an EKG. Pt states that she does have a cardiologist as well. Pt is agreeable to restarting the therapy although she was hesitant and really feels that something could be off. At this time Dr Brett Fairy didn't recommend a scan to be completed. Will send this note to the pt PCP to see if they can help facilitate a  Cardiac monitor set up and EKG. At this time we held off scheduling an apt to see if these things were helpful in either finding out what is going on or the therapy helping with treatment. Pt will call and update [place].

## 2018-02-21 DIAGNOSIS — Z6830 Body mass index (BMI) 30.0-30.9, adult: Secondary | ICD-10-CM | POA: Diagnosis not present

## 2018-02-21 DIAGNOSIS — Z01419 Encounter for gynecological examination (general) (routine) without abnormal findings: Secondary | ICD-10-CM | POA: Diagnosis not present

## 2018-02-21 DIAGNOSIS — N958 Other specified menopausal and perimenopausal disorders: Secondary | ICD-10-CM | POA: Diagnosis not present

## 2018-02-21 DIAGNOSIS — M8588 Other specified disorders of bone density and structure, other site: Secondary | ICD-10-CM | POA: Diagnosis not present

## 2018-02-23 DIAGNOSIS — Z1231 Encounter for screening mammogram for malignant neoplasm of breast: Secondary | ICD-10-CM | POA: Diagnosis not present

## 2018-02-23 DIAGNOSIS — R928 Other abnormal and inconclusive findings on diagnostic imaging of breast: Secondary | ICD-10-CM | POA: Diagnosis not present

## 2018-02-23 DIAGNOSIS — Z803 Family history of malignant neoplasm of breast: Secondary | ICD-10-CM | POA: Diagnosis not present

## 2018-02-28 DIAGNOSIS — N3942 Incontinence without sensory awareness: Secondary | ICD-10-CM | POA: Diagnosis not present

## 2018-02-28 DIAGNOSIS — R350 Frequency of micturition: Secondary | ICD-10-CM | POA: Diagnosis not present

## 2018-03-02 DIAGNOSIS — R928 Other abnormal and inconclusive findings on diagnostic imaging of breast: Secondary | ICD-10-CM | POA: Diagnosis not present

## 2018-03-02 DIAGNOSIS — Z803 Family history of malignant neoplasm of breast: Secondary | ICD-10-CM | POA: Diagnosis not present

## 2018-03-02 DIAGNOSIS — Z1231 Encounter for screening mammogram for malignant neoplasm of breast: Secondary | ICD-10-CM | POA: Diagnosis not present

## 2018-03-07 DIAGNOSIS — N3942 Incontinence without sensory awareness: Secondary | ICD-10-CM | POA: Diagnosis not present

## 2018-03-07 DIAGNOSIS — R350 Frequency of micturition: Secondary | ICD-10-CM | POA: Diagnosis not present

## 2018-03-20 ENCOUNTER — Other Ambulatory Visit: Payer: Self-pay | Admitting: Neurology

## 2018-03-21 DIAGNOSIS — Z87440 Personal history of urinary (tract) infections: Secondary | ICD-10-CM | POA: Diagnosis not present

## 2018-03-21 DIAGNOSIS — E039 Hypothyroidism, unspecified: Secondary | ICD-10-CM | POA: Diagnosis not present

## 2018-03-21 DIAGNOSIS — K219 Gastro-esophageal reflux disease without esophagitis: Secondary | ICD-10-CM | POA: Diagnosis not present

## 2018-03-21 DIAGNOSIS — E785 Hyperlipidemia, unspecified: Secondary | ICD-10-CM | POA: Diagnosis not present

## 2018-03-21 DIAGNOSIS — R413 Other amnesia: Secondary | ICD-10-CM | POA: Diagnosis not present

## 2018-03-21 DIAGNOSIS — N183 Chronic kidney disease, stage 3 (moderate): Secondary | ICD-10-CM | POA: Diagnosis not present

## 2018-03-21 DIAGNOSIS — Z862 Personal history of diseases of the blood and blood-forming organs and certain disorders involving the immune mechanism: Secondary | ICD-10-CM | POA: Diagnosis not present

## 2018-03-21 DIAGNOSIS — E559 Vitamin D deficiency, unspecified: Secondary | ICD-10-CM | POA: Diagnosis not present

## 2018-03-21 DIAGNOSIS — R7301 Impaired fasting glucose: Secondary | ICD-10-CM | POA: Diagnosis not present

## 2018-03-21 DIAGNOSIS — Z0000 Encounter for general adult medical examination without abnormal findings: Secondary | ICD-10-CM | POA: Diagnosis not present

## 2018-04-06 DIAGNOSIS — N3942 Incontinence without sensory awareness: Secondary | ICD-10-CM | POA: Diagnosis not present

## 2018-04-06 DIAGNOSIS — R350 Frequency of micturition: Secondary | ICD-10-CM | POA: Diagnosis not present

## 2018-05-17 DIAGNOSIS — R05 Cough: Secondary | ICD-10-CM | POA: Diagnosis not present

## 2018-05-23 DIAGNOSIS — H25011 Cortical age-related cataract, right eye: Secondary | ICD-10-CM | POA: Diagnosis not present

## 2018-05-23 DIAGNOSIS — H401131 Primary open-angle glaucoma, bilateral, mild stage: Secondary | ICD-10-CM | POA: Diagnosis not present

## 2018-05-31 DIAGNOSIS — N3942 Incontinence without sensory awareness: Secondary | ICD-10-CM | POA: Diagnosis not present

## 2018-05-31 DIAGNOSIS — R350 Frequency of micturition: Secondary | ICD-10-CM | POA: Diagnosis not present

## 2018-06-05 DIAGNOSIS — Z23 Encounter for immunization: Secondary | ICD-10-CM | POA: Diagnosis not present

## 2018-06-20 DIAGNOSIS — H401131 Primary open-angle glaucoma, bilateral, mild stage: Secondary | ICD-10-CM | POA: Diagnosis not present

## 2018-06-22 ENCOUNTER — Other Ambulatory Visit: Payer: Self-pay | Admitting: Neurology

## 2018-07-03 DIAGNOSIS — E7800 Pure hypercholesterolemia, unspecified: Secondary | ICD-10-CM | POA: Diagnosis not present

## 2018-07-03 DIAGNOSIS — E559 Vitamin D deficiency, unspecified: Secondary | ICD-10-CM | POA: Diagnosis not present

## 2018-08-10 DIAGNOSIS — J209 Acute bronchitis, unspecified: Secondary | ICD-10-CM | POA: Diagnosis not present

## 2018-09-14 DIAGNOSIS — R05 Cough: Secondary | ICD-10-CM | POA: Diagnosis not present

## 2018-09-21 DIAGNOSIS — R05 Cough: Secondary | ICD-10-CM | POA: Diagnosis not present

## 2018-09-21 DIAGNOSIS — E559 Vitamin D deficiency, unspecified: Secondary | ICD-10-CM | POA: Diagnosis not present

## 2018-09-21 DIAGNOSIS — R413 Other amnesia: Secondary | ICD-10-CM | POA: Diagnosis not present

## 2018-09-21 DIAGNOSIS — E785 Hyperlipidemia, unspecified: Secondary | ICD-10-CM | POA: Diagnosis not present

## 2018-09-21 DIAGNOSIS — E039 Hypothyroidism, unspecified: Secondary | ICD-10-CM | POA: Diagnosis not present

## 2018-09-21 DIAGNOSIS — G43909 Migraine, unspecified, not intractable, without status migrainosus: Secondary | ICD-10-CM | POA: Diagnosis not present

## 2018-09-21 DIAGNOSIS — K219 Gastro-esophageal reflux disease without esophagitis: Secondary | ICD-10-CM | POA: Diagnosis not present

## 2018-09-21 DIAGNOSIS — R7301 Impaired fasting glucose: Secondary | ICD-10-CM | POA: Diagnosis not present

## 2018-09-21 DIAGNOSIS — N183 Chronic kidney disease, stage 3 (moderate): Secondary | ICD-10-CM | POA: Diagnosis not present

## 2018-09-27 ENCOUNTER — Ambulatory Visit (INDEPENDENT_AMBULATORY_CARE_PROVIDER_SITE_OTHER): Payer: Medicare Other | Admitting: Pulmonary Disease

## 2018-09-27 ENCOUNTER — Encounter: Payer: Self-pay | Admitting: Pulmonary Disease

## 2018-09-27 VITALS — BP 164/88 | HR 87 | Ht 61.0 in | Wt 161.2 lb

## 2018-09-27 DIAGNOSIS — R059 Cough, unspecified: Secondary | ICD-10-CM

## 2018-09-27 DIAGNOSIS — R05 Cough: Secondary | ICD-10-CM | POA: Diagnosis not present

## 2018-09-27 MED ORDER — BENZONATATE 100 MG PO CAPS: 200.0000 mg | ORAL_CAPSULE | Freq: Three times a day (TID) | ORAL | 0 refills | Status: DC | PRN

## 2018-09-27 MED ORDER — CLARITHROMYCIN 250 MG PO TABS: 250.0000 mg | ORAL_TABLET | Freq: Two times a day (BID) | ORAL | 0 refills | Status: DC

## 2018-09-27 NOTE — Progress Notes (Signed)
Brittney [name]    [id number]    08/10/1935  Primary Care Physician:Barnes, Benjamine Mola, MD  Referring Physician: Leighton Ruff, [place], [place] [address]  Chief complaint:   Protracted cough  HPI:  Patient had a bronchitis around Christmas Was treated with a course of antibiotics Had a repeat course of antibiotics which seemed to help symptoms but did not lead to resolution  Did not tolerate steroids in the past-led to increased intraocular pressures  Over-the-counter cough medicines have not really helped  No underlying history of lung disease No history of asthma or allergies  She does have a history of reflux but not having any recent symptoms to suggest poor control  Never smoker  No pertinent occupational predisposition lung disease  Outpatient Encounter Medications as of 09/27/2018  Medication Sig  . ALPRAZolam (XANAX) 0.5 MG tablet TAKE 1 TABLET BY MOUTH AS NEEDED FOR ANXIETY OR VERTIGO  . butalbital-acetaminophen-caffeine (FIORICET, ESGIC) 50-325-40 MG tablet take 1 tablet by mouth if needed for headache, no more than 1 a day  . donepezil (ARICEPT) 10 MG tablet Take 1 tablet (10 mg total) by mouth at bedtime.  [name] ezetimibe (ZETIA) 10 MG tablet Take 10 mg by mouth daily.  [name] gabapentin (NEURONTIN) 100 MG capsule Take 100 mg by mouth every other day.  . levothyroxine (SYNTHROID, LEVOTHROID) 75 MCG tablet Take 75 mcg by mouth daily before breakfast.  . LUMIGAN 0.01 % SOLN INSTILL 1 GTT INTO OU NIGHTLY  . timolol (TIMOPTIC) 0.25 % ophthalmic solution instill 1 drop into both eyes once daily  . tizanidine (ZANAFLEX) 2 MG capsule Take 2 mg by mouth 3 (three) times daily as needed for muscle spasms.  . benzonatate (TESSALON PERLES) 100 MG capsule Take 2 capsules (200 mg total) by mouth 3 (three) times daily as needed for cough.  . clarithromycin (BIAXIN) 250 MG tablet Take 1 tablet (250 mg total) by mouth 2 (two) times daily.   No  facility-administered encounter medications on file as of 09/27/2018.     Allergies as of 09/27/2018 - Review Complete 09/27/2018  Allergen Reaction Noted  . Amitriptyline Other (See Comments) 01/03/2014  . Latex Other (See Comments) 01/03/2014  . Meperidine Other (See Comments) 04/30/2015  . Sulfa antibiotics Other (See Comments) 01/03/2014  . Vioxx [rofecoxib] Other (See Comments) 01/03/2014  . Augmentin [amoxicillin-pot clavulanate] Rash and Other (See Comments) 01/03/2014  . Lipitor [atorvastatin] Other (See Comments) 01/03/2014  . Motrin [ibuprofen] Swelling 01/03/2014  . Rosuvastatin Other (See Comments) 04/30/2015    Past Medical History:  Diagnosis Date  . CKD (chronic kidney disease), stage III ([place])   . Familial hypercholesterolemia   . GERD (gastroesophageal reflux disease)   . Glaucoma   . Hypothyroid   . Migraine headache   . Post herpetic neuralgia     Past Surgical History:  Procedure Laterality Date  . ANAL FISSURE REPAIR    . APPENDECTOMY    . BASAL CELL CARCINOMA EXCISION    . cataract surgery     left  . CHOLECYSTECTOMY    . hemithyroidectomy    . INCONTINENCE SURGERY    . THYROIDECTOMY    . TUBAL LIGATION    . VAGINAL HYSTERECTOMY    . vaginal vault repair      Family History  Problem Relation Age of Onset  . Heart disease Mother        chf  . Heart disease Sister  CABG    Social History   Socioeconomic History  . Marital status: Married    Spouse name: Nadara Mustard  . Number of children: 4  . Years of education: 11  . Highest education level: Not on file  Occupational History  . Not on file  Social Needs  . Financial resource strain: Not on file  . Food insecurity:    Worry: Not on file    Inability: Not on file  . Transportation needs:    Medical: Not on file    Non-medical: Not on file  Tobacco Use  . Smoking status: Never Smoker  . Smokeless tobacco: Never Used  Substance and Sexual Activity  . Alcohol use: Not on file    . Drug use: Not on file  . Sexual activity: Not on file  Lifestyle  . Physical activity:    Days per week: Not on file    Minutes per session: Not on file  . Stress: Not on file  Relationships  . Social connections:    Talks on phone: Not on file    Gets together: Not on file    Attends religious service: Not on file    Active member of club or organization: Not on file    Attends meetings of clubs or organizations: Not on file    Relationship status: Not on file  . Intimate partner violence:    Fear of current or ex partner: Not on file    Emotionally abused: Not on file    Physically abused: Not on file    Forced sexual activity: Not on file  Other Topics Concern  . Not on file  Social History Narrative  . Not on file    Review of Systems  Constitutional: Negative.   HENT: Negative.   Eyes: Negative.   Respiratory: Positive for cough.   Cardiovascular: Negative.   Gastrointestinal: Negative.     Vitals:   09/27/18 1506  BP: (!) 164/88  Pulse: 87  SpO2: 99%   Physical Exam  Constitutional: She appears well-developed and well-nourished.  HENT:  Head: Normocephalic and atraumatic.  Eyes: Pupils are equal, round, and reactive to light. Conjunctivae and EOM are normal. Right eye exhibits no discharge. Left eye exhibits no discharge.  Neck: Normal range of motion. No tracheal deviation present. No thyromegaly present.  Cardiovascular: Normal rate and regular rhythm.  Pulmonary/Chest: Effort normal and breath sounds normal. No respiratory distress. She has no wheezes. She has no rales. She exhibits no tenderness.  Abdominal: Soft. Bowel sounds are normal.    Assessment:  Protracted cough -Recent bronchitis -She does have a lot of allergies/sensitivities -Has no underlying lung disease -Recent chest x-ray was clear  Plan/Recommendations:  We will treat the patient with a course of Biaxin  If nonresolution of symptoms despite a repeat course of antibiotics  then will plan for CT scan of the chest  I will see her back in the office in about 4 weeks  Course of Biaxin and Tessalon Perles   Sherrilyn Rist MD [facility] 09/27/2018, 3:37 PM  CC: Leighton Ruff, MD

## 2018-09-27 NOTE — Patient Instructions (Addendum)
Protracted cough Normal chest x-ray recently/normal blood work  We will give you another course of antibiotic-Biaxin for 10 days Benzonatate-cough suppressants may help-call then We are avoiding steroids secondary to your glaucoma  I will see you back in about 4 weeks If symptoms are better we will not need to do any other testing if not we will get a CT scan of the chest  Call with any other concerns

## 2018-10-13 DIAGNOSIS — H409 Unspecified glaucoma: Secondary | ICD-10-CM | POA: Diagnosis not present

## 2018-10-13 DIAGNOSIS — N183 Chronic kidney disease, stage 3 (moderate): Secondary | ICD-10-CM | POA: Diagnosis not present

## 2018-10-13 DIAGNOSIS — I10 Essential (primary) hypertension: Secondary | ICD-10-CM | POA: Diagnosis not present

## 2018-10-13 DIAGNOSIS — E039 Hypothyroidism, unspecified: Secondary | ICD-10-CM | POA: Diagnosis not present

## 2018-10-25 ENCOUNTER — Other Ambulatory Visit: Payer: Self-pay

## 2018-10-25 ENCOUNTER — Ambulatory Visit (INDEPENDENT_AMBULATORY_CARE_PROVIDER_SITE_OTHER)
Admission: RE | Admit: 2018-10-25 | Discharge: 2018-10-25 | Disposition: A | Discharge status: Home / Self Care | Payer: Medicare Other | Source: Ambulatory Visit | Attending: Pulmonary Disease | Admitting: Pulmonary Disease

## 2018-10-25 ENCOUNTER — Encounter: Payer: Self-pay | Admitting: Pulmonary Disease

## 2018-10-25 ENCOUNTER — Ambulatory Visit (INDEPENDENT_AMBULATORY_CARE_PROVIDER_SITE_OTHER): Payer: Medicare Other | Admitting: Pulmonary Disease

## 2018-10-25 VITALS — BP 140/80 | HR 63 | Ht 61.0 in | Wt 158.0 lb

## 2018-10-25 DIAGNOSIS — R05 Cough: Secondary | ICD-10-CM

## 2018-10-25 DIAGNOSIS — R053 Chronic cough: Secondary | ICD-10-CM

## 2018-10-25 DIAGNOSIS — R059 Cough, unspecified: Secondary | ICD-10-CM

## 2018-10-25 LAB — CBC WITH DIFFERENTIAL/PLATELET
Basophils Absolute: 0.1 10*3/uL (ref 0.0–0.1)
Basophils Relative: 1.4 % (ref 0.0–3.0)
EOS PCT: 3.8 % (ref 0.0–5.0)
Eosinophils Absolute: 0.2 10*3/uL (ref 0.0–0.7)
HCT: 40.5 % (ref 36.0–46.0)
Hemoglobin: 13.4 g/dL (ref 12.0–15.0)
Lymphocytes Relative: 29.1 % (ref 12.0–46.0)
Lymphs Abs: 1.8 10*3/uL (ref 0.7–4.0)
MCHC: 33 g/dL (ref 30.0–36.0)
MCV: 81.8 fl (ref 78.0–100.0)
Monocytes Absolute: 0.5 10*3/uL (ref 0.1–1.0)
Monocytes Relative: 8.9 % (ref 3.0–12.0)
Neutro Abs: 3.4 10*3/uL (ref 1.4–7.7)
Neutrophils Relative %: 56.8 % (ref 43.0–77.0)
Platelets: 147 10*3/uL — ABNORMAL LOW (ref 150.0–400.0)
RBC: 4.94 Mil/uL (ref 3.87–5.11)
RDW: 14 % (ref 11.5–15.5)
WBC: 6.1 10*3/uL (ref 4.0–10.5)

## 2018-10-25 MED ORDER — LEVOCETIRIZINE DIHYDROCHLORIDE 5 MG PO TABS: 5.0000 mg | ORAL_TABLET | Freq: Every evening | ORAL | 3 refills | Status: DC

## 2018-10-25 NOTE — Patient Instructions (Signed)
Chronic cough with lower rib cage discomfort  Trial with Xyzal for possible allergies  Obtain chest x-ray, CBC with differentials, IgE levels, allergy panel  We will consider a CT scan of the chest depending on chest x-ray findings  Breathing study  We will see back in the office in about 4 weeks Use the allergy pill on a regular basis  Call with significant concerns

## 2018-10-25 NOTE — Progress Notes (Signed)
Brittney [name]    [id number]    11-Aug-1935  Primary Care Physician:Barnes, Benjamine Mola, MD  Referring Physician: Leighton Ruff, [place],  [address]  Chief complaint:   Protracted cough Did have slight improvement with use of antibiotics  HPI:  Patient had a bronchitis around Christmas Was treated with a course of antibiotics Recently treated with another course of antibiotics which helped symptoms a little bit She still has chest discomfort She does have some stuffiness, mucus production Not feeling acutely ill No fevers or chills No contact with anybody with a febrile illness  Symptoms not fully resolved at present  Did not tolerate steroids in the past-led to increased intraocular pressures  Over-the-counter cough medicines have not really helped  No underlying history of lung disease  May have possible allergies She does have a history of reflux but not having any recent symptoms to suggest poor control  Never smoker  No pertinent occupational predisposition lung disease  Outpatient Encounter Medications as of 10/25/2018  Medication Sig  . ALPRAZolam (XANAX) 0.5 MG tablet TAKE 1 TABLET BY MOUTH AS NEEDED FOR ANXIETY OR VERTIGO  . butalbital-acetaminophen-caffeine (FIORICET, ESGIC) 50-325-40 MG tablet take 1 tablet by mouth if needed for headache, no more than 1 a day  . donepezil (ARICEPT) 10 MG tablet Take 1 tablet (10 mg total) by mouth at bedtime.  [name] ezetimibe (ZETIA) 10 MG tablet Take 10 mg by mouth daily.  [name] gabapentin (NEURONTIN) 100 MG capsule Take 100 mg by mouth every other day.  . levothyroxine (SYNTHROID, LEVOTHROID) 75 MCG tablet Take 75 mcg by mouth daily before breakfast.  . LUMIGAN 0.01 % SOLN INSTILL 1 GTT INTO OU NIGHTLY  . timolol (TIMOPTIC) 0.25 % ophthalmic solution instill 1 drop into both eyes once daily  . tizanidine (ZANAFLEX) 2 MG capsule Take 2 mg by mouth 3 (three) times daily as needed for muscle  spasms.  . [DISCONTINUED] benzonatate (TESSALON PERLES) 100 MG capsule Take 2 capsules (200 mg total) by mouth 3 (three) times daily as needed for cough.  . [DISCONTINUED] clarithromycin (BIAXIN) 250 MG tablet Take 1 tablet (250 mg total) by mouth 2 (two) times daily.   No facility-administered encounter medications on file as of 10/25/2018.     Allergies as of 10/25/2018 - Review Complete 10/25/2018  Allergen Reaction Noted  . Amitriptyline Other (See Comments) 01/03/2014  . Latex Other (See Comments) 01/03/2014  . Meperidine Other (See Comments) 04/30/2015  . Sulfa antibiotics Other (See Comments) 01/03/2014  . Vioxx [rofecoxib] Other (See Comments) 01/03/2014  . Augmentin [amoxicillin-pot clavulanate] Rash and Other (See Comments) 01/03/2014  . Lipitor [atorvastatin] Other (See Comments) 01/03/2014  . Motrin [ibuprofen] Swelling 01/03/2014  . Rosuvastatin Other (See Comments) 04/30/2015    Past Medical History:  Diagnosis Date  . CKD (chronic kidney disease), stage III ([place])   . Familial hypercholesterolemia   . GERD (gastroesophageal reflux disease)   . Glaucoma   . Hypothyroid   . Migraine headache   . Post herpetic neuralgia     Past Surgical History:  Procedure Laterality Date  . ANAL FISSURE REPAIR    . APPENDECTOMY    . BASAL CELL CARCINOMA EXCISION    . cataract surgery     left  . CHOLECYSTECTOMY    . hemithyroidectomy    . INCONTINENCE SURGERY    . THYROIDECTOMY    . TUBAL LIGATION    . VAGINAL HYSTERECTOMY    .  vaginal vault repair      Family History  Problem Relation Age of Onset  . Heart disease Mother        chf  . Heart disease Sister        CABG    Social History   Socioeconomic History  . Marital status: Married    Spouse name: Nadara Mustard  . Number of children: 4  . Years of education: 39  . Highest education level: Not on file  Occupational History  . Not on file  Social Needs  . Financial resource strain: Not on file  . Food  insecurity:    Worry: Not on file    Inability: Not on file  . Transportation needs:    Medical: Not on file    Non-medical: Not on file  Tobacco Use  . Smoking status: Never Smoker  . Smokeless tobacco: Never Used  Substance and Sexual Activity  . Alcohol use: Not on file  . Drug use: Not on file  . Sexual activity: Not on file  Lifestyle  . Physical activity:    Days per week: Not on file    Minutes per session: Not on file  . Stress: Not on file  Relationships  . Social connections:    Talks on phone: Not on file    Gets together: Not on file    Attends religious service: Not on file    Active member of club or organization: Not on file    Attends meetings of clubs or organizations: Not on file    Relationship status: Not on file  . Intimate partner violence:    Fear of current or ex partner: Not on file    Emotionally abused: Not on file    Physically abused: Not on file    Forced sexual activity: Not on file  Other Topics Concern  . Not on file  Social History Narrative  . Not on file    Review of Systems  Constitutional: Negative.   HENT: Negative.   Eyes: Negative.   Respiratory: Positive for cough.   Cardiovascular: Negative.   Gastrointestinal: Negative.   All other systems reviewed and are negative.   Vitals:   10/25/18 1010  BP: 140/80  Pulse: 63  SpO2: 98%   Physical Exam  Constitutional: She appears well-developed and well-nourished.  HENT:  Head: Normocephalic and atraumatic.  Eyes: Pupils are equal, round, and reactive to light. Conjunctivae and EOM are normal. Right eye exhibits no discharge. Left eye exhibits no discharge.  Neck: Normal range of motion. No tracheal deviation present. No thyromegaly present.  Cardiovascular: Normal rate and regular rhythm.  Pulmonary/Chest: Effort normal and breath sounds normal. No respiratory distress. She has no wheezes. She has no rales. She exhibits no tenderness.    Assessment:  Protracted cough  -Symptoms are little bit better -Recent bronchitis, some improvement with recent use of antibiotics -Denies underlying lung disease, never smoker, no significant occupational exposure  Chest pain -Likely related to protracted cough  Plan/Recommendations:  Has cough is remaining protracted  We will obtain a pulmonary function study Obtain a CBC with differentials, IgE levels, Obtain chest x-ray-May require CT scan of the chest depending on findings Allergy panel  We will see her back in the office in 4 weeks Encouraged to call with any significant concerns  Sherrilyn Rist MD [facility] 10/25/2018, 10:13 AM  CC: Leighton Ruff, MD

## 2018-10-25 NOTE — Addendum Note (Signed)
Addended by: Suzzanne Cloud E on: 10/25/2018 10:47 AM   Modules accepted: Orders

## 2018-10-26 LAB — IGE: IgE (Immunoglobulin E), Serum: 8 kU/L (ref <=114)

## 2018-10-31 DIAGNOSIS — N183 Chronic kidney disease, stage 3 (moderate): Secondary | ICD-10-CM | POA: Diagnosis not present

## 2018-10-31 DIAGNOSIS — H409 Unspecified glaucoma: Secondary | ICD-10-CM | POA: Diagnosis not present

## 2018-10-31 DIAGNOSIS — I10 Essential (primary) hypertension: Secondary | ICD-10-CM | POA: Diagnosis not present

## 2018-10-31 DIAGNOSIS — E039 Hypothyroidism, unspecified: Secondary | ICD-10-CM | POA: Diagnosis not present

## 2018-11-07 ENCOUNTER — Ambulatory Visit: Payer: Medicare Other | Admitting: Nurse Practitioner

## 2018-11-07 ENCOUNTER — Ambulatory Visit: Payer: Medicare Other | Admitting: Neurology

## 2018-11-24 DIAGNOSIS — H409 Unspecified glaucoma: Secondary | ICD-10-CM | POA: Diagnosis not present

## 2018-11-24 DIAGNOSIS — E7800 Pure hypercholesterolemia, unspecified: Secondary | ICD-10-CM | POA: Diagnosis not present

## 2018-11-24 DIAGNOSIS — I10 Essential (primary) hypertension: Secondary | ICD-10-CM | POA: Diagnosis not present

## 2018-11-24 DIAGNOSIS — E039 Hypothyroidism, unspecified: Secondary | ICD-10-CM | POA: Diagnosis not present

## 2018-11-24 DIAGNOSIS — N183 Chronic kidney disease, stage 3 (moderate): Secondary | ICD-10-CM | POA: Diagnosis not present

## 2018-12-04 ENCOUNTER — Telehealth: Payer: Self-pay | Admitting: *Deleted

## 2018-12-04 ENCOUNTER — Encounter: Payer: Self-pay | Admitting: *Deleted

## 2018-12-04 NOTE — Telephone Encounter (Signed)
Consented to VV webex appt.  Due to current COVID 19 pandemic, our office is severely reducing in office visits for at least the next 2 weeks, in order to minimize the risk to our patients and healthcare providers.  Pt understands that although there may be some limitations with this type of visit, we will take all precautions to reduce any security or privacy concerns.  Pt understands that this will be treated like an in office visit and we will file with pt's insurance. Pt's email is [email or web address]. Pt understands that the [employer] webex software must be downloaded and operational on the device pt plans to use for the visit.

## 2018-12-05 ENCOUNTER — Ambulatory Visit: Payer: Medicare Other | Admitting: Pulmonary Disease

## 2018-12-06 NOTE — Progress Notes (Signed)
PATIENT: Brittney [name] DOB: Mar 28, 1935  REASON FOR VISIT: follow up HISTORY FROM: patient  Virtual Visit via Telephone Note  I connected with Brittney [name] on 12/07/18 at 10:30 AM EDT by telephone and verified that I am speaking with the correct person using two identifiers.   I discussed the limitations, risks, security and privacy concerns of performing an evaluation and management service by telephone and the availability of in person appointments. I also discussed with the patient that there may be a patient responsible charge related to this service. The patient expressed understanding and agreed to proceed.   History of Present Illness:  12/07/18 Brittney [name] is a 84 y.o. female for follow up of MCI on Aricpet 10mg .  She reports that she is doing very well at this time.  Her husband is with her today and states that he has noticed a significant improvement in her memory.  She is tolerating Aricept 10 mg at night.  She feels that retention of information during the conversation has improved significantly.  She does mention that she has had some difficulty resting at night.  She finds it more difficult to go to sleep.  This has been going on for greater than 6 months.    HISTORY (copied from Dr Dohmeier's note on 11/03/2017)  06/17/15 CD Brittney [name] is a 84 y.o. female , seen here as a referral/ revisit from Dr. Wilhemina Bonito for pseudoparasitic sensory abnormalities, psychosis.  Chief complaint according to patient : " The patient feels that small lesions are developing all over her skin including at the root of the eyelashes and upper lip"  She reports a sensation of movement under her skin. She does not feel that these are nocturnal or daytime active sensations but that they occur any time and that there perpetually there. She has tried with Q-tips to manipulate to get to the bottom of the skin lesions but has actually created some skin lesions in the process.   She had seen Dr. Wilhemina Bonito at [facility] dermat[facility] and endorsed symptoms of skin irritation little, bumps -changing in size, Itchiness,tenderness, sometimes soreness but also a creepy crawly sensation under the skin. This does not affect one region of the body alone but includes arms lower extremities trunk and face. His causes the patient discomfort and also has affected her sleep and actually consumes a significant time of her thoughts and daytime. Her husband agrees.  In addition Dr. Ronnald Ramp eluded to some cognitive difficulties she may have developed. He was concerned about  beginning memory loss disorder. Brittney [name] underwent 2 tests here today a Mini-Mental Status examination as well as a Montral cognitive assessment test. On the Mini-Mental Status Examination she missed 5 out of 5 points on attention and calculation and could not recall any of the 3 recall words.. I would have given her 22 out of 30 points for this performance. The Montral cognitive assessment test is much more difficult she was not able to solve the trail making test or draw a three-dimensional cube. She placed the contour and numbers into a clock face but not the hands. She was able to name 3 out of 3 animals was able to repeat a series of digits but had trouble with his serial 7 again. She was able to abstract, her word fluency was good with 11 F words, and she scored 6 out of 6 points on orientation date and place. This is similar to her Mini-Mental Status Examination.  It  is unusual for patient to be fully oriented to date place and time and have primarily visual spatial difficulties and recall difficulties.  It is clear visit short-term memory is affected but thus it should affect date and time as well. The patient is aware of her cognitive deficits and day-to-day life. The couple recently stayed in a hotel and the patient was not sure which direction to go to the elevator for example. She has not gotten lost outside. The  patient prepares about 1 warm meal per week, she states she now wears gloves and a Cap , is afraid to contaminate the food. She has never balanced the chequebook, she only worked 10 years of her life outside the home. She worked for [employer].   The patient is the oldest of 5 siblings and as far she knows not no other sibling has difficulties with cognition or memory. Her parents were not affected by dementia. She has 1 maternal first cousin who suffers from Alzheimer's disease.  UPDATE11/29/16 [name], 84 year old female returns for follow-up. She was initially evaluated on 06/17/2015 by Dr. Brett Fairy for memory loss and pseudo-parasitic sensory abnormalities. She continues to report a sensation of movement under her skin at times. She feels that her memory is better . MRI of the brain performed 07/06/2015 without acute findings but nonspecific chronic microvascular ischemic changes. Thyroid and B12 levels were normal She was placed on Seroquel and Aricept. She returns for follow-up.  UPDATE 01/15/2016 CM Brittney [name], 84 year old female returns for follow-up. She has a history of memory loss and is currently on Aricept 10 mg at bedtime however she reports vivid dreaming and she was asked to take in the morning. She continues to have occasional migraine is followed by her primary care and she takes Fioricet. Her memory score stable today. She tries to stay active. She returns for reevaluation with her husband.  Interval history from 07/22/2016, I have the pleasure of seeing Brittney [name] with her husband today she is meanwhile 22 years old and returned for a memory test. A Mini-Mental Status Examination was performed and she scored 29 out of 30 points again. She continues to take Aricept and feels that it has helped her. By today score she would not be considered having a memory loss disorder but a possible mild cognitive impairment, which I attributed to her hearing loss.  History from 03 August 2017.  I have the pleasure of seeing Brittney [name] again today for a yearly visit following her memory loss.  This year we performed a Montreal cognitive assessment and the patient scored 20 out of 30 points.  She had some difficulties with visual spatial recognition, she did not have difficulties with naming objects, repeating and repeating numbers.  She did very well with serial subtraction of sevens, she was able to abstract and she recalled 3 out of 5 immediate recall words.  She is fully oriented to place and time. The patient would like to be evaluated for migrainous headaches today, but also she is an established patient this will be a new problem.  The patient reports that her migraines started acutely on August 26 while on vacation she woke up in the early morning hours with a severe headache on both sides of her head, as she set up on her bedside she developed vertigo with the room was spinning rapidly around her, clockwise,  she felt very ill. Even when she got back to bed and laid down did the vertigo persist.  She felt nauseated and weak, and off balance. It resolved after 1 day, but some vertigo was still present.  2 days later she had another migraine with vertigo,this one  lasting 3 days.   On Nov 7th she was in bed, got dizzy and violently vomited, she also developed diarrhea.  On Nov 9th migraine again, with dizziness.  Nov 27th migraine , no dizziness.   Interval history from 03 November 2017, I have the pleasure of meeting with Mr. and Mrs. Diguglielmo today, Brittney [name] having undergone a transcranial Doppler study on 17 August 2017.  There was a slight difference between her vertebral pulsatility depth left versus right, and on the left side the vertebral pulse was 1.4 mm versus 1.08 on the right this was the only concerning part and is still an mild elevation overall.  These pulsatility indices are likely due to atherosclerosis of the vessels.  Dr. Erlinda Hong  interpreted the study.  I also see  Brittney [name] today to follow-up on subjective memory concerns.  She has no longer vertigo or migraine ! Her memory was tested today, and she has MCI, may be beginning dementia.     Montreal Cognitive Assessment  11/03/2017 08/03/2017 07/15/2015 06/17/2015  Visuospatial/ Executive (0/5) 2 2 4 2   Naming (0/3) 2 2 3 3   Attention: Read list of digits (0/2) 1 2 2 1   Attention: Read list of letters (0/1) 1 1 0 0  Attention: Serial 7 subtraction starting at 100 (0/3) 3 3 3  0  Language: Repeat phrase (0/2) 1 1 2 2   Language : Fluency (0/1) 0 0 0 1  Abstraction (0/2) 2 1 2 2   Delayed Recall (0/5) 3 3 3  0  Orientation (0/6) 6 5 5 6   Total 21 20 24 17   Adjusted Score (based on education) - - - 18     Observations/Objective:  Generalized: Well developed, in no acute distress  Mentation: Alert oriented to time, place, history taking. Follows all commands speech and language fluent   Assessment and Plan:  84 y.o. year old female  has a past medical history of CKD (chronic kidney disease), stage III ([place]), Familial hypercholesterolemia, GERD (gastroesophageal reflux disease), Glaucoma, Hypothyroid, Migraine headache, and Post herpetic neuralgia. with    ICD-10-CM   1. Amnestic MCI (mild cognitive impairment with memory loss) G31.84    Ms. Shoberg is doing very well on Aricept 10 mg daily without any obvious adverse effects.  She does feel that this is helped to improve her memory.  We are unable to complete MMSE today due to virtual environment however, blind Moca score is 21 of 22 and considered normal.  We will continue Aricept 10 mg daily.  I have also suggested that she try melatonin 3 mg every night at bedtime to help with sleep.  I have advised against regular use of alprazolam.  We have discussed side effects and risk of this medication including addictive properties.  I would like to see her back in the office in 3 months for a formal evaluation and repeat of MMSE.  She verbalizes  understanding and agreement with this plan.  No orders of the defined types were placed in this encounter.   No orders of the defined types were placed in this encounter.    Follow Up Instructions:  I discussed the assessment and treatment plan with the patient. The patient was provided an opportunity to ask questions and all were answered. The patient agreed with the plan and demonstrated an  understanding of the instructions.   The patient was advised to call back or seek an in-person evaluation if the symptoms worsen or if the condition fails to improve as anticipated.  I provided 30 minutes of non-face-to-face time during this encounter.  Patient is located at her place of residence during video conference.  Provider is located at her place of residence.  Liane Comber, RN helped to facilitate visit.   Debbora Presto, NP

## 2018-12-07 ENCOUNTER — Ambulatory Visit (INDEPENDENT_AMBULATORY_CARE_PROVIDER_SITE_OTHER): Payer: Medicare Other | Admitting: Family Medicine

## 2018-12-07 ENCOUNTER — Other Ambulatory Visit: Payer: Self-pay

## 2018-12-07 ENCOUNTER — Encounter: Payer: Self-pay | Admitting: Family Medicine

## 2018-12-07 DIAGNOSIS — G3184 Mild cognitive impairment, so stated: Secondary | ICD-10-CM

## 2018-12-08 ENCOUNTER — Ambulatory Visit: Payer: Medicare Other | Admitting: Family Medicine

## 2019-01-03 ENCOUNTER — Telehealth: Payer: Self-pay

## 2019-01-03 NOTE — Telephone Encounter (Signed)
Patient has been scheduled for a follow up with Amy 06/05/2019.

## 2019-01-05 DIAGNOSIS — N183 Chronic kidney disease, stage 3 (moderate): Secondary | ICD-10-CM | POA: Diagnosis not present

## 2019-01-05 DIAGNOSIS — E039 Hypothyroidism, unspecified: Secondary | ICD-10-CM | POA: Diagnosis not present

## 2019-01-05 DIAGNOSIS — H409 Unspecified glaucoma: Secondary | ICD-10-CM | POA: Diagnosis not present

## 2019-01-05 DIAGNOSIS — E7800 Pure hypercholesterolemia, unspecified: Secondary | ICD-10-CM | POA: Diagnosis not present

## 2019-01-05 DIAGNOSIS — I10 Essential (primary) hypertension: Secondary | ICD-10-CM | POA: Diagnosis not present

## 2019-01-11 ENCOUNTER — Other Ambulatory Visit: Payer: Self-pay | Admitting: Neurology

## 2019-01-11 MED ORDER — ALPRAZOLAM 0.5 MG PO TABS: ORAL_TABLET | ORAL | 0 refills | Status: DC

## 2019-01-22 ENCOUNTER — Ambulatory Visit: Payer: Medicare Other | Admitting: Pulmonary Disease

## 2019-01-30 DIAGNOSIS — H401131 Primary open-angle glaucoma, bilateral, mild stage: Secondary | ICD-10-CM | POA: Diagnosis not present

## 2019-02-19 ENCOUNTER — Other Ambulatory Visit: Payer: Self-pay | Admitting: Pulmonary Disease

## 2019-02-19 DIAGNOSIS — R05 Cough: Secondary | ICD-10-CM

## 2019-02-19 DIAGNOSIS — R053 Chronic cough: Secondary | ICD-10-CM

## 2019-02-20 ENCOUNTER — Other Ambulatory Visit: Payer: Self-pay | Admitting: Pulmonary Disease

## 2019-02-20 DIAGNOSIS — R053 Chronic cough: Secondary | ICD-10-CM

## 2019-02-20 DIAGNOSIS — R05 Cough: Secondary | ICD-10-CM

## 2019-02-26 DIAGNOSIS — Z1231 Encounter for screening mammogram for malignant neoplasm of breast: Secondary | ICD-10-CM | POA: Diagnosis not present

## 2019-02-27 DIAGNOSIS — Z01419 Encounter for gynecological examination (general) (routine) without abnormal findings: Secondary | ICD-10-CM | POA: Diagnosis not present

## 2019-02-27 DIAGNOSIS — Z6830 Body mass index (BMI) 30.0-30.9, adult: Secondary | ICD-10-CM | POA: Diagnosis not present

## 2019-03-15 DIAGNOSIS — H409 Unspecified glaucoma: Secondary | ICD-10-CM | POA: Diagnosis not present

## 2019-03-15 DIAGNOSIS — N183 Chronic kidney disease, stage 3 (moderate): Secondary | ICD-10-CM | POA: Diagnosis not present

## 2019-03-15 DIAGNOSIS — E7800 Pure hypercholesterolemia, unspecified: Secondary | ICD-10-CM | POA: Diagnosis not present

## 2019-03-15 DIAGNOSIS — I10 Essential (primary) hypertension: Secondary | ICD-10-CM | POA: Diagnosis not present

## 2019-03-15 DIAGNOSIS — E039 Hypothyroidism, unspecified: Secondary | ICD-10-CM | POA: Diagnosis not present

## 2019-03-27 DIAGNOSIS — I10 Essential (primary) hypertension: Secondary | ICD-10-CM | POA: Diagnosis not present

## 2019-03-27 DIAGNOSIS — H409 Unspecified glaucoma: Secondary | ICD-10-CM | POA: Diagnosis not present

## 2019-03-27 DIAGNOSIS — E7800 Pure hypercholesterolemia, unspecified: Secondary | ICD-10-CM | POA: Diagnosis not present

## 2019-03-27 DIAGNOSIS — N183 Chronic kidney disease, stage 3 (moderate): Secondary | ICD-10-CM | POA: Diagnosis not present

## 2019-03-27 DIAGNOSIS — E039 Hypothyroidism, unspecified: Secondary | ICD-10-CM | POA: Diagnosis not present

## 2019-03-30 DIAGNOSIS — G43909 Migraine, unspecified, not intractable, without status migrainosus: Secondary | ICD-10-CM | POA: Diagnosis not present

## 2019-03-30 DIAGNOSIS — R413 Other amnesia: Secondary | ICD-10-CM | POA: Diagnosis not present

## 2019-03-30 DIAGNOSIS — E039 Hypothyroidism, unspecified: Secondary | ICD-10-CM | POA: Diagnosis not present

## 2019-03-30 DIAGNOSIS — N183 Chronic kidney disease, stage 3 (moderate): Secondary | ICD-10-CM | POA: Diagnosis not present

## 2019-03-30 DIAGNOSIS — E785 Hyperlipidemia, unspecified: Secondary | ICD-10-CM | POA: Diagnosis not present

## 2019-03-30 DIAGNOSIS — E559 Vitamin D deficiency, unspecified: Secondary | ICD-10-CM | POA: Diagnosis not present

## 2019-03-30 DIAGNOSIS — K219 Gastro-esophageal reflux disease without esophagitis: Secondary | ICD-10-CM | POA: Diagnosis not present

## 2019-03-30 DIAGNOSIS — R7301 Impaired fasting glucose: Secondary | ICD-10-CM | POA: Diagnosis not present

## 2019-04-02 DIAGNOSIS — Z0000 Encounter for general adult medical examination without abnormal findings: Secondary | ICD-10-CM | POA: Diagnosis not present

## 2019-04-02 DIAGNOSIS — Z6829 Body mass index (BMI) 29.0-29.9, adult: Secondary | ICD-10-CM | POA: Diagnosis not present

## 2019-04-03 DIAGNOSIS — E7800 Pure hypercholesterolemia, unspecified: Secondary | ICD-10-CM | POA: Diagnosis not present

## 2019-04-03 DIAGNOSIS — Z6829 Body mass index (BMI) 29.0-29.9, adult: Secondary | ICD-10-CM | POA: Diagnosis not present

## 2019-04-03 DIAGNOSIS — B0229 Other postherpetic nervous system involvement: Secondary | ICD-10-CM | POA: Diagnosis not present

## 2019-04-03 DIAGNOSIS — N183 Chronic kidney disease, stage 3 (moderate): Secondary | ICD-10-CM | POA: Diagnosis not present

## 2019-04-03 DIAGNOSIS — E039 Hypothyroidism, unspecified: Secondary | ICD-10-CM | POA: Diagnosis not present

## 2019-04-03 DIAGNOSIS — E559 Vitamin D deficiency, unspecified: Secondary | ICD-10-CM | POA: Diagnosis not present

## 2019-04-03 DIAGNOSIS — R7303 Prediabetes: Secondary | ICD-10-CM | POA: Diagnosis not present

## 2019-04-18 DIAGNOSIS — M545 Low back pain: Secondary | ICD-10-CM | POA: Diagnosis not present

## 2019-04-23 ENCOUNTER — Other Ambulatory Visit: Payer: Self-pay | Admitting: Pulmonary Disease

## 2019-04-23 DIAGNOSIS — R05 Cough: Secondary | ICD-10-CM

## 2019-04-23 DIAGNOSIS — R053 Chronic cough: Secondary | ICD-10-CM

## 2019-04-24 ENCOUNTER — Other Ambulatory Visit: Payer: Self-pay | Admitting: Pulmonary Disease

## 2019-04-24 DIAGNOSIS — R05 Cough: Secondary | ICD-10-CM

## 2019-04-24 DIAGNOSIS — R053 Chronic cough: Secondary | ICD-10-CM

## 2019-04-25 ENCOUNTER — Telehealth: Payer: Self-pay | Admitting: Pulmonary Disease

## 2019-04-25 DIAGNOSIS — R053 Chronic cough: Secondary | ICD-10-CM

## 2019-04-25 DIAGNOSIS — R05 Cough: Secondary | ICD-10-CM

## 2019-04-25 MED ORDER — LEVOCETIRIZINE DIHYDROCHLORIDE 5 MG PO TABS: 5.0000 mg | ORAL_TABLET | Freq: Every evening | ORAL | 2 refills | Status: DC

## 2019-04-25 NOTE — Telephone Encounter (Signed)
Call returned to patient, confirmed DOB, medication, and pharmacy. Medication sent in. Refill sent. Nothing further needed at this time.

## 2019-04-30 DIAGNOSIS — E7800 Pure hypercholesterolemia, unspecified: Secondary | ICD-10-CM | POA: Diagnosis not present

## 2019-04-30 DIAGNOSIS — N183 Chronic kidney disease, stage 3 (moderate): Secondary | ICD-10-CM | POA: Diagnosis not present

## 2019-04-30 DIAGNOSIS — I10 Essential (primary) hypertension: Secondary | ICD-10-CM | POA: Diagnosis not present

## 2019-04-30 DIAGNOSIS — H409 Unspecified glaucoma: Secondary | ICD-10-CM | POA: Diagnosis not present

## 2019-04-30 DIAGNOSIS — E039 Hypothyroidism, unspecified: Secondary | ICD-10-CM | POA: Diagnosis not present

## 2019-05-04 DIAGNOSIS — Z23 Encounter for immunization: Secondary | ICD-10-CM | POA: Diagnosis not present

## 2019-05-17 ENCOUNTER — Other Ambulatory Visit: Payer: Self-pay

## 2019-05-17 ENCOUNTER — Ambulatory Visit (INDEPENDENT_AMBULATORY_CARE_PROVIDER_SITE_OTHER): Payer: Medicare Other | Admitting: Family Medicine

## 2019-05-17 ENCOUNTER — Encounter: Payer: Self-pay | Admitting: Family Medicine

## 2019-05-17 VITALS — BP 156/72 | HR 60 | Temp 95.9°F | Ht 61.0 in | Wt 161.0 lb

## 2019-05-17 DIAGNOSIS — G4700 Insomnia, unspecified: Secondary | ICD-10-CM | POA: Diagnosis not present

## 2019-05-17 DIAGNOSIS — G3184 Mild cognitive impairment, so stated: Secondary | ICD-10-CM | POA: Diagnosis not present

## 2019-05-17 NOTE — Progress Notes (Signed)
PATIENT: Brittney [name] DOB: November 25, 1934  REASON FOR VISIT: follow up HISTORY FROM: patient  Chief Complaint  Patient presents with  . Follow-up    6 mon f/u. Husband present. Rm 2 .No new concerns at this time.      HISTORY OF PRESENT ILLNESS: Today 05/17/19 Brittney [name] is a 84 y.o. female here today for follow up for memory impairment. She is tolerating Aricept well. She feels that memory is stable. She is able to perform all ADL's independently. She does not drive by choice due to concerns of vertigo. She lives with her husband who reports she is doing very well. No gait changes or falls. No assistive devices. She does have some trouble with insomnia at times. She feels that her mind races when she is trying to rest. She has tried melatonin without relief.   HISTORY: (copied from my note on 12/07/2018)  Brittney [name] is a 84 y.o. female for follow up of MCI on Aricpet 10mg .  She reports that she is doing very well at this time.  Her husband is with her today and states that he has noticed a significant improvement in her memory.  She is tolerating Aricept 10 mg at night.  She feels that retention of information during the conversation has improved significantly.  She does mention that she has had some difficulty resting at night.  She finds it more difficult to go to sleep.  This has been going on for greater than 6 months.  HISTORY (copied from Dr Dohmeier's note on 11/03/2017)  06/17/15 CD Brittney [name] is a 84 y.o. female , seen here as a referral/ revisit from Dr. Wilhemina Bonito for pseudoparasitic sensory abnormalities, psychosis.  Chief complaint according to patient : " The patient feels that small lesions are developing all over her skin including at the root of the eyelashes and upper lip"  She reports a sensation of movement under her skin. She does not feel that these are nocturnal or daytime active sensations but that they occur any time and that there  perpetually there. She has tried with Q-tips to manipulate to get to the bottom of the skin lesions but has actually created some skin lesions in the process.  She had seen Dr. Wilhemina Bonito at [facility] dermatology and endorsed symptoms of skin irritation little, bumps -changing in size, Itchiness,tenderness, sometimes soreness but also a creepy crawly sensation under the skin. This does not affect one region of the body alone but includes arms lower extremities trunk and face. His causes the patient discomfort and also has affected her sleep and actually consumes a significant time of her thoughts and daytime. Her husband agrees.  In addition Dr. Ronnald Ramp eluded to some cognitive difficulties she may have developed. He was concerned about beginning memory loss disorder. Brittney [name] underwent 2 tests here today a Mini-Mental Status examination as well as a [place] cognitive assessment test. On the Mini-Mental Status Examination she missed 5 out of 5 points on attention and calculation and could not recall any of the 3 recall words.. I would have given her 22 out of 30 points for this performance. The [place] cognitive assessment test is much more difficult she was not able to solve the trail making test or draw a three-dimensional cube. She placed the contour and numbers into a clock face but not the hands. She was able to name 3 out of 3 animals was able to repeat a series of digits but had trouble  with his serial 7 again. She was able to abstract, her word fluency was good with 11 F words, and she scored 6 out of 6 points on orientation date and place. This is similar to her Mini-Mental Status Examination.  It is unusual for patient to be fully oriented to date place and time and have primarily visual spatial difficulties and recall difficulties. It is clear visit short-term memory is affected but thus it should affect date and time as well. The patient is aware of her cognitive deficits and  day-to-day life. The couple recently stayed in a hotel and the patient was not sure which direction to go to the elevator for example. She has not gotten lost outside. The patient prepares about 1 warm meal per week, she states she now wears gloves and a Cap , is afraid to contaminate the food. She has never balanced the chequebook, she only worked 10 years of her life outside the home. She worked for [employer].   The patient is the oldest of 5 siblings and as far she knows not no other sibling has difficulties with cognition or memory. Her parents were not affected by dementia. She has 1 maternal first cousin who suffers from Alzheimer's disease.  UPDATE11/29/16Ms Brooks [name] 84 year old female returns for follow-up. She was initially evaluated on 06/17/2015 by Dr. Brett Fairy for memory loss and pseudo-parasitic sensory abnormalities. She continues to report a sensation of movement under her skin at times. She feels that her memory is better . MRI of the brain performed 07/06/2015 without acute findings but nonspecific chronic microvascular ischemic changes. Thyroid and B12 levels were normal She was placed on Seroquel and Aricept. She returns for follow-up.  UPDATE 01/15/2016 CM Brittney. Graben, 84 year old female returns for follow-up. She has a history of memory loss and is currently on Aricept 10 mg at bedtime however she reports vivid dreaming and she was asked to take in the morning. She continues to have occasional migraine is followed by her primary care and she takes Fioricet. Her memory score stable today. She tries to stay active. She returns for reevaluation with her husband.  Interval history from 07/22/2016, I have the pleasure of seeing Mrs. [name] with her husband today she is meanwhile 36 years old and returned for a memory test. A Mini-Mental Status Examination was performed and she scored 29 out of 30 points again. She continues to take Aricept and feels that it has helped her. By today score  she would not be considered having a memory loss disorder but a possible mild cognitive impairment, which I attributed to her hearing loss.  History from 03 August 2017. I have the pleasure of seeing Brittney. Denija Vantassel again today for a yearly visit following her memory loss. This year we performed a [place] cognitive assessment and the patient scored 20 out of 30 points. She had some difficulties with visual spatial recognition, she did not have difficulties with naming objects, repeating and repeating numbers. She did very well with serial subtraction of sevens, she was able to abstract and she recalled 3 out of 5 immediate recall words. She is fully oriented to place and time. The patient would like to be evaluated for migrainous headaches today, but also she is an established patient this will be a new problem. The patient reports that her migraines started acutely on August 26 while on vacation she woke up in the early morning hours with a severe headache on both sides of her head, as she set up  on her bedside she developed vertigo with the room was spinning rapidly around her, clockwise, she felt very ill. Even when she got back to bed and laid down did the vertigo persist. She felt nauseated and weak, and off balance. It resolved after 1 day, but some vertigo was still present.  2 days later she had another migraine with vertigo,this one lasting 3 days.  On Nov 7th she was in bed, got dizzy and violently vomited, she also developed diarrhea.  On Nov 9th migraine again, with dizziness.  Nov 27th migraine , no dizziness.  Interval history from 03 November 2017, I have the pleasure of meeting with Mr. and Mrs. Willocks today, Mrs. Dampier having undergone a transcranial Doppler study on 17 August 2017. There was a slight difference between her vertebral pulsatility depth left versus right, and on the left side the vertebral pulse was 1.4 mm versus 1.08 on the right this was the only concerning  part and is still an mild elevation overall. These pulsatility indices are likely due to atherosclerosis of the vessels. Dr. Mike Craze the study. I also see Mrs. Sarchet today to follow-up on subjective memory concerns. She has no longer vertigo or migraine ! Her memory was tested today, and she has MCI, may be beginning dementia.   REVIEW OF SYSTEMS: Out of a complete 14 system review of symptoms, the patient complains only of the following symptoms, frequency of urination, memory loss, dizziness, headaches, achy muscles and muscle cramps and all other reviewed systems are negative.   ALLERGIES: Allergies  Allergen Reactions  . Amitriptyline Other (See Comments)    Skewed vision  . Latex Other (See Comments)    Blisters skin  . Meperidine Other (See Comments)    Mental status changes  . Sulfa Antibiotics Other (See Comments)    Reaction unknown  . Vioxx [Rofecoxib] Other (See Comments)    Reaction unknown  . Augmentin [Amoxicillin-Pot Clavulanate] Rash and Other (See Comments)  . Lipitor [Atorvastatin] Other (See Comments)    myalgias  . Motrin [Ibuprofen] Swelling  . Rosuvastatin Other (See Comments)    Joint and muscle pain    HOME MEDICATIONS: Outpatient Medications Prior to Visit  Medication Sig Dispense Refill  . ALPRAZolam (XANAX) 0.5 MG tablet TAKE 1 TABLET BY MOUTH AS NEEDED FOR ANXIETY OR VERTIGO 30 tablet 0  . butalbital-acetaminophen-caffeine (FIORICET, ESGIC) 50-325-40 MG tablet take 1 tablet by mouth if needed for headache, no more than 1 a day  0  . donepezil (ARICEPT) 10 MG tablet Take 1 tablet (10 mg total) by mouth at bedtime. 90 tablet 3  . ezetimibe (ZETIA) 10 MG tablet Take 10 mg by mouth daily.    [name] gabapentin (NEURONTIN) 100 MG capsule Take 200 mg by mouth daily.     [name] levocetirizine (XYZAL) 5 MG tablet Take 1 tablet (5 mg total) by mouth every evening. 90 tablet 2  . levothyroxine (SYNTHROID, LEVOTHROID) 75 MCG tablet Take 75 mcg by mouth daily  before breakfast.    . LUMIGAN 0.01 % SOLN INSTILL 1 GTT INTO OU NIGHTLY  6  . timolol (TIMOPTIC) 0.25 % ophthalmic solution instill 1 drop into both eyes once daily  0  . tizanidine (ZANAFLEX) 2 MG capsule Take 2 mg by mouth 3 (three) times daily as needed for muscle spasms.     No facility-administered medications prior to visit.     PAST MEDICAL HISTORY: Past Medical History:  Diagnosis Date  . CKD (chronic kidney disease), stage III   .  Familial hypercholesterolemia   . GERD (gastroesophageal reflux disease)   . Glaucoma   . Hypothyroid   . Migraine headache   . Post herpetic neuralgia     PAST SURGICAL HISTORY: Past Surgical History:  Procedure Laterality Date  . ANAL FISSURE REPAIR    . APPENDECTOMY    . BASAL CELL CARCINOMA EXCISION    . cataract surgery     left  . CHOLECYSTECTOMY    . hemithyroidectomy    . INCONTINENCE SURGERY    . THYROIDECTOMY    . TUBAL LIGATION    . VAGINAL HYSTERECTOMY    . vaginal vault repair      FAMILY HISTORY: Family History  Problem Relation Age of Onset  . Heart disease Mother        chf  . Heart disease Sister        CABG    SOCIAL HISTORY: Social History   Socioeconomic History  . Marital status: Married    Spouse name: Nadara Mustard  . Number of children: 4  . Years of education: 45  . Highest education level: Not on file  Occupational History  . Not on file  Social Needs  . Financial resource strain: Not on file  . Food insecurity    Worry: Not on file    Inability: Not on file  . Transportation needs    Medical: Not on file    Non-medical: Not on file  Tobacco Use  . Smoking status: Never Smoker  . Smokeless tobacco: Never Used  Substance and Sexual Activity  . Alcohol use: Not on file  . Drug use: Not on file  . Sexual activity: Not on file  Lifestyle  . Physical activity    Days per week: Not on file    Minutes per session: Not on file  . Stress: Not on file  Relationships  . Social Product manager on phone: Not on file    Gets together: Not on file    Attends religious service: Not on file    Active member of club or organization: Not on file    Attends meetings of clubs or organizations: Not on file    Relationship status: Not on file  . Intimate partner violence    Fear of current or ex partner: Not on file    Emotionally abused: Not on file    Physically abused: Not on file    Forced sexual activity: Not on file  Other Topics Concern  . Not on file  Social History Narrative  . Not on file      PHYSICAL EXAM  Vitals:   05/17/19 1056  BP: (!) 156/72  Pulse: 60  Temp: (!) 95.9 F (35.5 C)  TempSrc: Oral  Weight: 161 lb (73 kg)  Height: 5\' 1"  (1.549 m)   Body mass index is 30.42 kg/m.  Generalized: Well developed, in no acute distress  Cardiology: normal rate and rhythm, no murmur noted Respiratory: clear to auscultation bilaterally  Mallampati 2+ Neurological examination  Mentation: Alert oriented to time, place, history taking. Follows all commands speech and language fluent Cranial nerve II-XII: Pupils were equal round reactive to light. Extraocular movements were full, visual field were full on confrontational test. Facial sensation and strength were normal. Uvula tongue midline. Head turning and shoulder shrug  were normal and symmetric. Motor: The motor testing reveals 5 over 5 strength of all 4 extremities. Good symmetric motor tone is noted throughout.  Sensory: Sensory testing  is intact to soft touch on all 4 extremities. No evidence of extinction is noted.  Coordination: Cerebellar testing reveals good finger-nose-finger and heel-to-shin bilaterally.  Gait and station: Gait is normal.   DIAGNOSTIC DATA (LABS, IMAGING, TESTING) - I reviewed patient records, labs, notes, testing and imaging myself where available.  MMSE - Mini Mental State Exam 05/17/2019 11/03/2017 07/22/2016  Not completed: (No Data) - -  Orientation to time 5 5 5   Orientation to  Place 5 5 5   Registration 3 3 3   Attention/ Calculation 5 5 5   Recall 2 0 2  Language- name 2 objects 2 2 2   Language- repeat 0 1 1  Language- follow 3 step command 3 3 3   Language- read & follow direction 1 1 1   Write a sentence 1 1 1   Copy design 1 1 1   Copy design-comments - - -  Total score 28 27 29      Lab Results  Component Value Date   WBC 6.1 10/25/2018   HGB 13.4 10/25/2018   HCT 40.5 10/25/2018   MCV 81.8 10/25/2018   PLT 147.0 (L) 10/25/2018      Component Value Date/Time   NA 141 06/17/2015 1147   NA 142 04/30/2015 1503   K 4.2 06/17/2015 1147   K 4.0 04/30/2015 1503   CL 99 06/17/2015 1147   CO2 22 06/17/2015 1147   CO2 31 (H) 04/30/2015 1503   GLUCOSE 98 06/17/2015 1147   GLUCOSE 100 04/30/2015 1503   BUN 14 06/17/2015 1147   BUN 16.5 04/30/2015 1503   CREATININE 1.01 (H) 06/17/2015 1147   CREATININE 1.2 (H) 04/30/2015 1503   CALCIUM 9.6 06/17/2015 1147   CALCIUM 9.4 04/30/2015 1503   PROT 6.6 06/17/2015 1147   PROT 6.4 04/30/2015 1503   ALBUMIN 4.6 06/17/2015 1147   ALBUMIN 3.8 04/30/2015 1503   AST 19 06/17/2015 1147   AST 16 04/30/2015 1503   ALT 14 06/17/2015 1147   ALT 11 04/30/2015 1503   ALKPHOS 122 (H) 06/17/2015 1147   ALKPHOS 115 04/30/2015 1503   BILITOT 0.3 06/17/2015 1147   BILITOT 0.20 04/30/2015 1503   GFRNONAA 53 (L) 06/17/2015 1147   GFRAA 62 06/17/2015 1147   Lab Results  Component Value Date   CHOL 161 05/26/2014   HDL 60 05/26/2014   LDLCALC 88 05/26/2014   TRIG 66 05/26/2014   CHOLHDL 2.7 05/26/2014   No results found for: HGBA1C No results found for: VITAMINB12 Lab Results  Component Value Date   TSH 1.210 06/17/2015    ASSESSMENT AND PLAN 84 y.o. year old female  has a past medical history of CKD (chronic kidney disease), stage III, Familial hypercholesterolemia, GERD (gastroesophageal reflux disease), Glaucoma, Hypothyroid, Migraine headache, and Post herpetic neuralgia. here with     ICD-10-CM   1.  Amnestic MCI (mild cognitive impairment with memory loss)  G31.84   2. Insomnia, unspecified type  G47.00     Brittney. Laforte is doing very well from a memory perspective on Aricept 10 mg daily.  MMSE is 28 of 30 today.  We will continue current therapy.  Memory compensation strategies advised.  She does have some concerns of insomnia.  Is tried melatonin over-the-counter in the past with no benefit.  We have discussed trying valerian root over-the-counter.  This may help with anxiety and sleep.  Screening for sleep apnea was unremarkable.  I have suggested that she reach out to Dr. Drema Dallas for consideration SSRI if insomnia and mind  racing continues.  Regular activity and healthy lifestyle habits advised.  She will follow-up with me in 1 year, sooner if needed.  She verbalizes understanding and agreement with this plan.   No orders of the defined types were placed in this encounter.    No orders of the defined types were placed in this encounter.     I spent 15 minutes with the patient. 50% of this time was spent counseling and educating patient on plan of care and medications.    Debbora Presto, FNP-C 05/17/2019, 11:53 AM [facility] [address], [place], [place] [address] [phone]

## 2019-05-17 NOTE — Patient Instructions (Signed)
Continue current treatment plan  Follow up in 1 year   Memory Compensation Strategies  1. Use "WARM" strategy.  W= write it down  A= associate it  R= repeat it  M= make a mental note  2.   You can keep a Social worker.  Use a 3-ring notebook with sections for the following: calendar, important names and phone numbers,  medications, doctors' names/phone numbers, lists/reminders, and a section to journal what you did  each day.   3.    Use a calendar to write appointments down.  4.    Write yourself a schedule for the day.  This can be placed on the calendar or in a separate section of the Memory Notebook.  Keeping a  regular schedule can help memory.  5.    Use medication organizer with sections for each day or morning/evening pills.  You may need help loading it  6.    Keep a basket, or pegboard by the door.  Place items that you need to take out with you in the basket or on the pegboard.  You may also want to  include a message board for reminders.  7.    Use sticky notes.  Place sticky notes with reminders in a place where the task is performed.  For example: " turn off the  stove" placed by the stove, "lock the door" placed on the door at eye level, " take your medications" on  the bathroom mirror or by the place where you normally take your medications.  8.    Use alarms/timers.  Use while cooking to remind yourself to check on food or as a reminder to take your medicine, or as a  reminder to make a call, or as a reminder to perform another task, etc.

## 2019-06-05 ENCOUNTER — Ambulatory Visit: Payer: Self-pay | Admitting: Family Medicine

## 2019-06-05 DIAGNOSIS — H401131 Primary open-angle glaucoma, bilateral, mild stage: Secondary | ICD-10-CM | POA: Diagnosis not present

## 2019-06-12 DIAGNOSIS — N3942 Incontinence without sensory awareness: Secondary | ICD-10-CM | POA: Diagnosis not present

## 2019-06-12 DIAGNOSIS — R350 Frequency of micturition: Secondary | ICD-10-CM | POA: Diagnosis not present

## 2019-06-14 DIAGNOSIS — M79659 Pain in unspecified thigh: Secondary | ICD-10-CM | POA: Diagnosis not present

## 2019-06-14 DIAGNOSIS — R109 Unspecified abdominal pain: Secondary | ICD-10-CM | POA: Diagnosis not present

## 2019-06-14 DIAGNOSIS — M549 Dorsalgia, unspecified: Secondary | ICD-10-CM | POA: Diagnosis not present

## 2019-06-20 ENCOUNTER — Other Ambulatory Visit: Payer: Self-pay | Admitting: Pulmonary Disease

## 2019-06-20 DIAGNOSIS — R053 Chronic cough: Secondary | ICD-10-CM

## 2019-06-20 DIAGNOSIS — R05 Cough: Secondary | ICD-10-CM

## 2019-06-25 DIAGNOSIS — R109 Unspecified abdominal pain: Secondary | ICD-10-CM | POA: Diagnosis not present

## 2019-06-25 DIAGNOSIS — M791 Myalgia, unspecified site: Secondary | ICD-10-CM | POA: Diagnosis not present

## 2019-06-25 DIAGNOSIS — M545 Low back pain: Secondary | ICD-10-CM | POA: Diagnosis not present

## 2019-06-27 DIAGNOSIS — R109 Unspecified abdominal pain: Secondary | ICD-10-CM | POA: Diagnosis not present

## 2019-06-29 ENCOUNTER — Other Ambulatory Visit: Payer: Self-pay | Admitting: Family Medicine

## 2019-06-29 DIAGNOSIS — R109 Unspecified abdominal pain: Secondary | ICD-10-CM

## 2019-07-04 ENCOUNTER — Ambulatory Visit
Admission: RE | Admit: 2019-07-04 | Discharge: 2019-07-04 | Disposition: A | Discharge status: Home / Self Care | Payer: Medicare Other | Source: Ambulatory Visit | Attending: Family Medicine | Admitting: Family Medicine

## 2019-07-04 DIAGNOSIS — R109 Unspecified abdominal pain: Secondary | ICD-10-CM

## 2019-07-04 DIAGNOSIS — K5730 Diverticulosis of large intestine without perforation or abscess without bleeding: Secondary | ICD-10-CM | POA: Diagnosis not present

## 2019-07-04 MED ORDER — IOPAMIDOL (ISOVUE-300) INJECTION 61%
80.0000 mL | Freq: Once | INTRAVENOUS | Status: AC | PRN
  Administered 2019-07-04: 10:00:00 80 mL via INTRAVENOUS

## 2019-07-05 DIAGNOSIS — N183 Chronic kidney disease, stage 3 unspecified: Secondary | ICD-10-CM | POA: Diagnosis not present

## 2019-07-05 DIAGNOSIS — E039 Hypothyroidism, unspecified: Secondary | ICD-10-CM | POA: Diagnosis not present

## 2019-07-05 DIAGNOSIS — H409 Unspecified glaucoma: Secondary | ICD-10-CM | POA: Diagnosis not present

## 2019-07-05 DIAGNOSIS — E7800 Pure hypercholesterolemia, unspecified: Secondary | ICD-10-CM | POA: Diagnosis not present

## 2019-07-05 DIAGNOSIS — I10 Essential (primary) hypertension: Secondary | ICD-10-CM | POA: Diagnosis not present

## 2019-07-10 ENCOUNTER — Other Ambulatory Visit: Payer: Self-pay | Admitting: Sports Medicine

## 2019-07-10 DIAGNOSIS — M5136 Other intervertebral disc degeneration, lumbar region: Secondary | ICD-10-CM

## 2019-07-10 DIAGNOSIS — M25552 Pain in left hip: Secondary | ICD-10-CM | POA: Diagnosis not present

## 2019-07-10 DIAGNOSIS — M25551 Pain in right hip: Secondary | ICD-10-CM | POA: Diagnosis not present

## 2019-07-10 DIAGNOSIS — M545 Low back pain: Secondary | ICD-10-CM | POA: Diagnosis not present

## 2019-08-04 ENCOUNTER — Other Ambulatory Visit: Payer: Self-pay

## 2019-08-04 ENCOUNTER — Ambulatory Visit
Admission: RE | Admit: 2019-08-04 | Discharge: 2019-08-04 | Disposition: A | Discharge status: Home / Self Care | Payer: Medicare Other | Source: Ambulatory Visit | Attending: Sports Medicine | Admitting: Sports Medicine

## 2019-08-04 DIAGNOSIS — M5136 Other intervertebral disc degeneration, lumbar region: Secondary | ICD-10-CM

## 2019-09-20 DIAGNOSIS — M545 Low back pain: Secondary | ICD-10-CM | POA: Diagnosis not present

## 2019-09-20 DIAGNOSIS — M5416 Radiculopathy, lumbar region: Secondary | ICD-10-CM | POA: Diagnosis not present

## 2019-09-21 ENCOUNTER — Ambulatory Visit: Payer: Medicare Other | Attending: Family Medicine

## 2019-09-21 DIAGNOSIS — Z23 Encounter for immunization: Secondary | ICD-10-CM | POA: Insufficient documentation

## 2019-09-21 NOTE — Progress Notes (Signed)
   Covid-19 Vaccination Clinic  Name:  Brittney [name]    MRN: [id number] DOB: [birth year]  09/21/2019  [name] was observed post Covid-19 immunization for 15 minutes without incidence. She was provided with Vaccine Information Sheet and instruction to access the V-Safe system.   [name] was instructed to call 911 with any severe reactions post vaccine: [name] Difficulty breathing  . Swelling of your face and throat  . A fast heartbeat  . A bad rash all over your body  . Dizziness and weakness    Immunizations Administered    Name Date Dose VIS Date Route   Pfizer COVID-19 Vaccine 09/21/2019  5:02 PM 0.3 mL 07/27/2019 Intramuscular   Manufacturer: [place]   Lot: [id number]   [place]: [id number]

## 2019-09-28 DIAGNOSIS — M5416 Radiculopathy, lumbar region: Secondary | ICD-10-CM | POA: Diagnosis not present

## 2019-09-28 DIAGNOSIS — M545 Low back pain: Secondary | ICD-10-CM | POA: Diagnosis not present

## 2019-10-09 ENCOUNTER — Ambulatory Visit: Payer: Medicare Other

## 2019-10-16 ENCOUNTER — Ambulatory Visit: Payer: Medicare Other | Attending: Internal Medicine

## 2019-10-16 DIAGNOSIS — Z23 Encounter for immunization: Secondary | ICD-10-CM | POA: Insufficient documentation

## 2019-10-16 NOTE — Progress Notes (Signed)
   Covid-19 Vaccination Clinic  Name:  Brittney [name]    MRN: [id number] DOB: 06-Aug-1935  10/16/2019  [name] was observed post Covid-19 immunization for 15 minutes without incident. She was provided with Vaccine Information Sheet and instruction to access the V-Safe system.   [name] was instructed to call 911 with any severe reactions post vaccine: [name] Difficulty breathing  . Swelling of face and throat  . A fast heartbeat  . A bad rash all over body  . Dizziness and weakness   Immunizations Administered    Name Date Dose VIS Date Route   Pfizer COVID-19 Vaccine 10/16/2019  3:51 PM 0.3 mL 07/27/2019 Intramuscular   Manufacturer: [place]   Lot: [id number]   [place]: [id number]

## 2019-10-18 DIAGNOSIS — M7062 Trochanteric bursitis, left hip: Secondary | ICD-10-CM | POA: Diagnosis not present

## 2019-10-18 DIAGNOSIS — M25551 Pain in right hip: Secondary | ICD-10-CM | POA: Diagnosis not present

## 2019-10-18 DIAGNOSIS — M545 Low back pain: Secondary | ICD-10-CM | POA: Diagnosis not present

## 2019-10-18 DIAGNOSIS — M7061 Trochanteric bursitis, right hip: Secondary | ICD-10-CM | POA: Diagnosis not present

## 2019-10-24 DIAGNOSIS — M7061 Trochanteric bursitis, right hip: Secondary | ICD-10-CM | POA: Diagnosis not present

## 2019-10-29 DIAGNOSIS — R350 Frequency of micturition: Secondary | ICD-10-CM | POA: Diagnosis not present

## 2019-10-29 DIAGNOSIS — E039 Hypothyroidism, unspecified: Secondary | ICD-10-CM | POA: Diagnosis not present

## 2019-10-29 DIAGNOSIS — E7800 Pure hypercholesterolemia, unspecified: Secondary | ICD-10-CM | POA: Diagnosis not present

## 2019-10-29 DIAGNOSIS — R7303 Prediabetes: Secondary | ICD-10-CM | POA: Diagnosis not present

## 2019-11-27 DIAGNOSIS — H401131 Primary open-angle glaucoma, bilateral, mild stage: Secondary | ICD-10-CM | POA: Diagnosis not present

## 2019-12-06 DIAGNOSIS — L820 Inflamed seborrheic keratosis: Secondary | ICD-10-CM | POA: Diagnosis not present

## 2019-12-06 DIAGNOSIS — D485 Neoplasm of uncertain behavior of skin: Secondary | ICD-10-CM | POA: Diagnosis not present

## 2019-12-06 DIAGNOSIS — L821 Other seborrheic keratosis: Secondary | ICD-10-CM | POA: Diagnosis not present

## 2019-12-06 DIAGNOSIS — L918 Other hypertrophic disorders of the skin: Secondary | ICD-10-CM | POA: Diagnosis not present

## 2019-12-06 DIAGNOSIS — I788 Other diseases of capillaries: Secondary | ICD-10-CM | POA: Diagnosis not present

## 2019-12-06 DIAGNOSIS — Z85828 Personal history of other malignant neoplasm of skin: Secondary | ICD-10-CM | POA: Diagnosis not present

## 2019-12-06 DIAGNOSIS — L723 Sebaceous cyst: Secondary | ICD-10-CM | POA: Diagnosis not present

## 2019-12-06 DIAGNOSIS — C44519 Basal cell carcinoma of skin of other part of trunk: Secondary | ICD-10-CM | POA: Diagnosis not present

## 2019-12-11 DIAGNOSIS — I10 Essential (primary) hypertension: Secondary | ICD-10-CM | POA: Diagnosis not present

## 2019-12-11 DIAGNOSIS — E039 Hypothyroidism, unspecified: Secondary | ICD-10-CM | POA: Diagnosis not present

## 2019-12-11 DIAGNOSIS — N183 Chronic kidney disease, stage 3 unspecified: Secondary | ICD-10-CM | POA: Diagnosis not present

## 2019-12-11 DIAGNOSIS — E7800 Pure hypercholesterolemia, unspecified: Secondary | ICD-10-CM | POA: Diagnosis not present

## 2019-12-11 DIAGNOSIS — H409 Unspecified glaucoma: Secondary | ICD-10-CM | POA: Diagnosis not present

## 2020-01-09 DIAGNOSIS — H409 Unspecified glaucoma: Secondary | ICD-10-CM | POA: Diagnosis not present

## 2020-01-09 DIAGNOSIS — N183 Chronic kidney disease, stage 3 unspecified: Secondary | ICD-10-CM | POA: Diagnosis not present

## 2020-01-09 DIAGNOSIS — E039 Hypothyroidism, unspecified: Secondary | ICD-10-CM | POA: Diagnosis not present

## 2020-01-09 DIAGNOSIS — E7800 Pure hypercholesterolemia, unspecified: Secondary | ICD-10-CM | POA: Diagnosis not present

## 2020-01-09 DIAGNOSIS — I10 Essential (primary) hypertension: Secondary | ICD-10-CM | POA: Diagnosis not present

## 2020-03-19 DIAGNOSIS — M545 Low back pain: Secondary | ICD-10-CM | POA: Diagnosis not present

## 2020-03-19 DIAGNOSIS — M25551 Pain in right hip: Secondary | ICD-10-CM | POA: Diagnosis not present

## 2020-03-19 DIAGNOSIS — M7061 Trochanteric bursitis, right hip: Secondary | ICD-10-CM | POA: Diagnosis not present

## 2020-03-24 DIAGNOSIS — H903 Sensorineural hearing loss, bilateral: Secondary | ICD-10-CM | POA: Diagnosis not present

## 2020-03-30 DIAGNOSIS — E7800 Pure hypercholesterolemia, unspecified: Secondary | ICD-10-CM | POA: Diagnosis not present

## 2020-03-30 DIAGNOSIS — H409 Unspecified glaucoma: Secondary | ICD-10-CM | POA: Diagnosis not present

## 2020-03-30 DIAGNOSIS — I10 Essential (primary) hypertension: Secondary | ICD-10-CM | POA: Diagnosis not present

## 2020-03-30 DIAGNOSIS — E039 Hypothyroidism, unspecified: Secondary | ICD-10-CM | POA: Diagnosis not present

## 2020-03-30 DIAGNOSIS — N183 Chronic kidney disease, stage 3 unspecified: Secondary | ICD-10-CM | POA: Diagnosis not present

## 2020-04-29 DIAGNOSIS — H401131 Primary open-angle glaucoma, bilateral, mild stage: Secondary | ICD-10-CM | POA: Diagnosis not present

## 2020-05-01 DIAGNOSIS — E559 Vitamin D deficiency, unspecified: Secondary | ICD-10-CM | POA: Diagnosis not present

## 2020-05-01 DIAGNOSIS — E7800 Pure hypercholesterolemia, unspecified: Secondary | ICD-10-CM | POA: Diagnosis not present

## 2020-05-01 DIAGNOSIS — R7303 Prediabetes: Secondary | ICD-10-CM | POA: Diagnosis not present

## 2020-05-01 DIAGNOSIS — Z23 Encounter for immunization: Secondary | ICD-10-CM | POA: Diagnosis not present

## 2020-05-01 DIAGNOSIS — Z862 Personal history of diseases of the blood and blood-forming organs and certain disorders involving the immune mechanism: Secondary | ICD-10-CM | POA: Diagnosis not present

## 2020-05-01 DIAGNOSIS — Z0000 Encounter for general adult medical examination without abnormal findings: Secondary | ICD-10-CM | POA: Diagnosis not present

## 2020-05-01 DIAGNOSIS — G629 Polyneuropathy, unspecified: Secondary | ICD-10-CM | POA: Diagnosis not present

## 2020-05-01 DIAGNOSIS — E039 Hypothyroidism, unspecified: Secondary | ICD-10-CM | POA: Diagnosis not present

## 2020-05-01 DIAGNOSIS — M8588 Other specified disorders of bone density and structure, other site: Secondary | ICD-10-CM | POA: Diagnosis not present

## 2020-05-01 DIAGNOSIS — G43909 Migraine, unspecified, not intractable, without status migrainosus: Secondary | ICD-10-CM | POA: Diagnosis not present

## 2020-05-01 DIAGNOSIS — N183 Chronic kidney disease, stage 3 unspecified: Secondary | ICD-10-CM | POA: Diagnosis not present

## 2020-05-01 DIAGNOSIS — R350 Frequency of micturition: Secondary | ICD-10-CM | POA: Diagnosis not present

## 2020-05-07 ENCOUNTER — Other Ambulatory Visit: Payer: Self-pay | Admitting: Family Medicine

## 2020-05-07 DIAGNOSIS — Z1231 Encounter for screening mammogram for malignant neoplasm of breast: Secondary | ICD-10-CM

## 2020-05-07 DIAGNOSIS — M8580 Other specified disorders of bone density and structure, unspecified site: Secondary | ICD-10-CM

## 2020-05-08 DIAGNOSIS — R7989 Other specified abnormal findings of blood chemistry: Secondary | ICD-10-CM | POA: Diagnosis not present

## 2020-05-22 ENCOUNTER — Inpatient Hospital Stay: Payer: Medicare Other

## 2020-05-22 ENCOUNTER — Other Ambulatory Visit: Payer: Self-pay

## 2020-05-22 ENCOUNTER — Inpatient Hospital Stay: Payer: Medicare Other | Attending: Hematology and Oncology | Admitting: Hematology and Oncology

## 2020-05-22 VITALS — BP 164/73 | HR 69 | Temp 98.2°F | Resp 18 | Ht 61.0 in | Wt 162.5 lb

## 2020-05-22 DIAGNOSIS — D696 Thrombocytopenia, unspecified: Secondary | ICD-10-CM | POA: Insufficient documentation

## 2020-05-22 DIAGNOSIS — E785 Hyperlipidemia, unspecified: Secondary | ICD-10-CM | POA: Diagnosis not present

## 2020-05-22 DIAGNOSIS — N189 Chronic kidney disease, unspecified: Secondary | ICD-10-CM | POA: Diagnosis not present

## 2020-05-22 DIAGNOSIS — K219 Gastro-esophageal reflux disease without esophagitis: Secondary | ICD-10-CM | POA: Insufficient documentation

## 2020-05-22 DIAGNOSIS — E039 Hypothyroidism, unspecified: Secondary | ICD-10-CM | POA: Diagnosis not present

## 2020-05-22 DIAGNOSIS — G43909 Migraine, unspecified, not intractable, without status migrainosus: Secondary | ICD-10-CM | POA: Diagnosis not present

## 2020-05-22 LAB — CBC WITH DIFFERENTIAL (CANCER CENTER ONLY)
Abs Immature Granulocytes: 0.02 10*3/uL (ref 0.00–0.07)
Basophils Absolute: 0.1 10*3/uL (ref 0.0–0.1)
Basophils Relative: 2 %
Eosinophils Absolute: 0.2 10*3/uL (ref 0.0–0.5)
Eosinophils Relative: 3 %
HCT: 38.6 % (ref 36.0–46.0)
Hemoglobin: 12.4 g/dL (ref 12.0–15.0)
Immature Granulocytes: 0 %
Lymphocytes Relative: 33 %
Lymphs Abs: 1.9 10*3/uL (ref 0.7–4.0)
MCH: 25.6 pg — ABNORMAL LOW (ref 26.0–34.0)
MCHC: 32.1 g/dL (ref 30.0–36.0)
MCV: 79.8 fL — ABNORMAL LOW (ref 80.0–100.0)
Monocytes Absolute: 0.4 10*3/uL (ref 0.1–1.0)
Monocytes Relative: 8 %
Neutro Abs: 3 10*3/uL (ref 1.7–7.7)
Neutrophils Relative %: 54 %
Platelet Count: 67 10*3/uL — ABNORMAL LOW (ref 150–400)
RBC: 4.84 MIL/uL (ref 3.87–5.11)
RDW: 14.6 % (ref 11.5–15.5)
WBC Count: 5.6 10*3/uL (ref 4.0–10.5)
nRBC: 0 % (ref 0.0–0.2)

## 2020-05-22 LAB — FOLATE: Folate: 36 ng/mL (ref >5.9)

## 2020-05-22 LAB — VITAMIN B12: Vitamin B-12: 270 pg/mL (ref 180–914)

## 2020-05-22 LAB — CMP (CANCER CENTER ONLY)
ALT: 13 U/L (ref 0–44)
AST: 17 U/L (ref 15–41)
Albumin: 3.8 g/dL (ref 3.5–5.0)
Alkaline Phosphatase: 108 U/L (ref 38–126)
Anion gap: 4 — ABNORMAL LOW (ref 5–15)
BUN: 17 mg/dL (ref 8–23)
CO2: 32 mmol/L (ref 22–32)
Calcium: 9.5 mg/dL (ref 8.9–10.3)
Chloride: 105 mmol/L (ref 98–111)
Creatinine: 1.24 mg/dL — ABNORMAL HIGH (ref 0.44–1.00)
GFR, Estimated: 40 mL/min — ABNORMAL LOW (ref >60)
Glucose, Bld: 111 mg/dL — ABNORMAL HIGH (ref 70–99)
Potassium: 3.9 mmol/L (ref 3.5–5.1)
Sodium: 141 mmol/L (ref 135–145)
Total Bilirubin: 0.3 mg/dL (ref 0.3–1.2)
Total Protein: 6.6 g/dL (ref 6.5–8.1)

## 2020-05-22 LAB — LACTATE DEHYDROGENASE: LDH: 180 U/L (ref 98–192)

## 2020-05-22 LAB — HEPATITIS B SURFACE ANTIGEN: Hepatitis B Surface Ag: NONREACTIVE

## 2020-05-22 LAB — PLATELET BY CITRATE

## 2020-05-22 LAB — HEPATITIS B SURFACE ANTIBODY,QUALITATIVE: Hep B S Ab: NONREACTIVE

## 2020-05-22 LAB — SAVE SMEAR(SSMR), FOR PROVIDER SLIDE REVIEW

## 2020-05-22 LAB — HEPATITIS C ANTIBODY: HCV Ab: NONREACTIVE

## 2020-05-22 NOTE — Progress Notes (Signed)
[place] [phone]   Fax:([phone] [place] NOTE  Patient Care Team: Leighton Ruff, MD as PCP - General (Family Medicine)  Hematological/Oncological History # Thrombocytopenia 05/25/2014: WBC 8.2, Hgb 14.4, MCV 81.3, Plt 162 06/17/2015: WBC 5.5, Hgb 13.9, MCV 81, Plt 124 10/25/2018: WBC 6.1, Hgb 13.4, MCV 81.8, Plt 147 05/01/2020: WBC 6.5, Hgb 13.8, MCV 78.2, Plt 79 05/22/2020: establish care with Dr. Lorenso Courier   CHIEF COMPLAINTS/PURPOSE OF CONSULTATION:  "Thrombocytopenia "  HISTORY OF PRESENTING ILLNESS:  Brittney [name] 85 y.o. female with medical history significant for CKD, HLD, GERD, hypothyroidism, and migraine headache who presents for evaluation of chronic thrombocytopenia.   On review of the previous records Brittney [name] has a low-grade thrombocytopenia dating back to at least 2016.  On 06/17/2015 the patient was found to have a white blood cell count of 5.5, hemoglobin 13.9, MCV of 81, and a platelet count of 124.  On 10/25/2018 the patient was found to have a platelet count of 147.  Most recently on 05/01/2020 the patient was found to have a platelet count of 79.  Due to concern for this chronic and worsening thrombocytopenia the patient was referred to hematology for further evaluation and management.  On exam today Brittney [name] reports that she has not had any issues with easy bleeding, dark stools, or bleeding gums.  She does note that she tends to bruise more easily.  She reports that she currently eats an unrestricted diet but tends to avoid nuts and seeds due to her diverticulosis.  She notes that she has no history of liver disease and has never been told she had a liver problem before.  She does report chronic issues with her spine including 2 bulging disks for which she is recently required injections.  On further discussion she notes that her family history is remarkable for breast cancer in her mother, cardiac arrest in her  father, type 2 diabetes in her brother, and pulmonary problems in her sister.  She reports that she is a never smoker never drinker and worked at [employer] when she was employed.  She notes that she has been having some leg and hip discomfort especially at night, but otherwise has had no issues with fevers, chills, sweats, nausea, vomiting or diarrhea.  Full 10 point ROS is listed below.  MEDICAL HISTORY:  Past Medical History:  Diagnosis Date  . CKD (chronic kidney disease), stage III   . Familial hypercholesterolemia   . GERD (gastroesophageal reflux disease)   . Glaucoma   . Hypothyroid   . Migraine headache   . Post herpetic neuralgia     SURGICAL HISTORY: Past Surgical History:  Procedure Laterality Date  . ANAL FISSURE REPAIR    . APPENDECTOMY    . BASAL CELL CARCINOMA EXCISION    . cataract surgery     left  . CHOLECYSTECTOMY    . hemithyroidectomy    . INCONTINENCE SURGERY    . THYROIDECTOMY    . TUBAL LIGATION    . VAGINAL HYSTERECTOMY    . vaginal vault repair      SOCIAL HISTORY: Social History   Socioeconomic History  . Marital status: Married    Spouse name: Nadara Mustard  . Number of children: 4  . Years of education: 71  . Highest education level: Not on file  Occupational History  . Not on file  Tobacco Use  . Smoking status: Never Smoker  . Smokeless tobacco: Never Used  Substance and Sexual Activity  .  Alcohol use: Not on file  . Drug use: Not on file  . Sexual activity: Not on file  Other Topics Concern  . Not on file  Social History Narrative  . Not on file   Social Determinants of Health   Financial Resource Strain:   . Difficulty of Paying Living Expenses: Not on file  Food Insecurity:   . Worried About Charity fundraiser in the Last Year: Not on file  . Ran Out of Food in the Last Year: Not on file  Transportation Needs:   . Lack of Transportation (Medical): Not on file  . Lack of Transportation (Non-Medical): Not on file  Physical  Activity:   . Days of Exercise per Week: Not on file  . Minutes of Exercise per Session: Not on file  Stress:   . Feeling of Stress : Not on file  Social Connections:   . Frequency of Communication with Friends and Family: Not on file  . Frequency of Social Gatherings with Friends and Family: Not on file  . Attends Religious Services: Not on file  . Active Member of Clubs or Organizations: Not on file  . Attends Archivist Meetings: Not on file  . Marital Status: Not on file  Intimate Partner Violence:   . Fear of Current or Ex-Partner: Not on file  . Emotionally Abused: Not on file  . Physically Abused: Not on file  . Sexually Abused: Not on file    FAMILY HISTORY: Family History  Problem Relation Age of Onset  . Heart disease Mother        chf  . Heart disease Sister        CABG    ALLERGIES:  is allergic to amitriptyline, latex, meperidine, estradiol, sulfa antibiotics, vioxx [rofecoxib], augmentin [amoxicillin-pot clavulanate], lipitor [atorvastatin], motrin [ibuprofen], and rosuvastatin.  MEDICATIONS:  Current Outpatient Medications  Medication Sig Dispense Refill  . ALPRAZolam (XANAX) 0.5 MG tablet TAKE 1 TABLET BY MOUTH AS NEEDED FOR ANXIETY OR VERTIGO 30 tablet 0  . butalbital-acetaminophen-caffeine (FIORICET, ESGIC) 50-325-40 MG tablet take 1 tablet by mouth if needed for headache, no more than 1 a day  0  . donepezil (ARICEPT) 10 MG tablet Take 1 tablet (10 mg total) by mouth at bedtime. 90 tablet 3  . ezetimibe (ZETIA) 10 MG tablet Take 10 mg by mouth daily.    [name] gabapentin (NEURONTIN) 300 MG capsule Take 300 mg by mouth 3 (three) times daily.    [name] levocetirizine (XYZAL) 5 MG tablet TAKE 1 TABLET BY MOUTH EVERY EVENING 30 tablet 5  . levothyroxine (SYNTHROID, LEVOTHROID) 75 MCG tablet Take 75 mcg by mouth daily before breakfast.    . LUMIGAN 0.01 % SOLN INSTILL 1 GTT INTO OU NIGHTLY  6  . MYRBETRIQ 50 MG TB24 tablet Take 50 mg by mouth daily.    .  timolol (TIMOPTIC) 0.25 % ophthalmic solution instill 1 drop into both eyes once daily  0  . tizanidine (ZANAFLEX) 2 MG capsule Take 2 mg by mouth 3 (three) times daily as needed for muscle spasms.     No current facility-administered medications for this visit.    REVIEW OF SYSTEMS:   Constitutional: ( - ) fevers, ( - )  chills , ( - ) night sweats Eyes: ( - ) blurriness of vision, ( - ) double vision, ( - ) watery eyes Ears, nose, mouth, throat, and face: ( - ) mucositis, ( - ) sore throat Respiratory: ( - )  cough, ( - ) dyspnea, ( - ) wheezes Cardiovascular: ( - ) palpitation, ( - ) chest discomfort, ( - ) lower extremity swelling Gastrointestinal:  ( - ) nausea, ( - ) heartburn, ( - ) change in bowel habits Skin: ( - ) abnormal skin rashes Lymphatics: ( - ) new lymphadenopathy, ( - ) easy bruising Neurological: ( - ) numbness, ( - ) tingling, ( - ) new weaknesses Behavioral/Psych: ( - ) mood change, ( - ) new changes  All other systems were reviewed with the patient and are negative.  PHYSICAL EXAMINATION:  Vitals:   05/22/20 1300  BP: (!) 164/73  Pulse: 69  Resp: 18  Temp: 98.2 F (36.8 C)  SpO2: 100%   Filed Weights   05/22/20 1300  Weight: 162 lb 8 oz (73.7 kg)    GENERAL: well appearing elderly Caucasian female in NAD  SKIN: skin color, texture, turgor are normal, no rashes or significant lesions EYES: conjunctiva are pink and non-injected, sclera clear LUNGS: clear to auscultation and percussion with normal breathing effort HEART: regular rate & rhythm and no murmurs and no lower extremity edema Musculoskeletal: no cyanosis of digits and no clubbing  PSYCH: alert & oriented x 3, fluent speech NEURO: no focal motor/sensory deficits  LABORATORY DATA:  I have reviewed the data as listed CBC Latest Ref Rng & Units 05/22/2020 10/25/2018 06/17/2015  WBC 4.0 - 10.5 K/uL 5.6 6.1 5.5  Hemoglobin 12.0 - 15.0 g/dL 12.4 13.4 13.9  Hematocrit 36 - 46 % 38.6 40.5 42.1    Platelets 150 - 400 K/uL 67(L) 147.0(L) 124(L)    CMP Latest Ref Rng & Units 05/22/2020 06/17/2015 04/30/2015  Glucose 70 - 99 mg/dL 111(H) 98 100  BUN 8 - 23 mg/dL 17 14 16.5  Creatinine 0.44 - 1.00 mg/dL 1.24(H) 1.01(H) 1.2(H)  Sodium 135 - 145 mmol/L 141 141 142  Potassium 3.5 - 5.1 mmol/L 3.9 4.2 4.0  Chloride 98 - 111 mmol/L 105 99 -  CO2 22 - 32 mmol/L 32 22 31(H)  Calcium 8.9 - 10.3 mg/dL 9.5 9.6 9.4  Total Protein 6.5 - 8.1 g/dL 6.6 6.6 6.4  Total Bilirubin 0.3 - 1.2 mg/dL 0.3 0.3 0.20  Alkaline Phos 38 - 126 U/L 108 122(H) 115  AST 15 - 41 U/L _0 ALT 0 - 44 U/L _1 BLOOD FILM: Pending review  RADIOGRAPHIC STUDIES: I have personally reviewed the radiological images as listed and agreed with the findings in the report. No results found.  ASSESSMENT & PLAN Brittney [name] 85 y.o. female with medical history significant for CKD, HLD, GERD, hypothyroidism, and migraine headache who presents for evaluation of chronic thrombocytopenia.  After review the labs, review the records, discussion with the patient the etiology of this patient's thrombocytopenia is unclear.  She has had a mild thrombocytopenia which is rapidly worsened in the interim since her initial drop in 2016.  She has no labs indicative of liver disease and her other counts are otherwise stable.    In order to assess this we will do a full nutritional assessment, review of the peripheral blood film, and initial malignancy work-up with SPEP and serum free light chains.  In the event we are unable to find an etiology for this patient's thrombocytopenia I would recommend we proceed with a bone marrow biopsy and consideration of a splenic ultrasound, though the splenic ultrasound would likely be of less yield given her recent CT  abdomen pelvis in November 2020.  #Thrombocytopenia --We will order hepatitis serologies including hepatitis B, and hepatitis C --Nutritional evaluation with vitamin B12 and  folate --Platelet count bicitrate as well as a review of peripheral blood film --Begin hematological lengthy work-up with SPEP and serum free light chains --If no clear etiology can be discerned would recommend proceeding with bone marrow biopsy --Return to clinic pending the results of the above labs.  Orders Placed This Encounter  Procedures  . CBC with Differential (Cancer Center Only)    Standing Status:   Future    Number of Occurrences:   1    Standing Expiration Date:   05/22/2021  . CMP ([place] only)    Standing Status:   Future    Number of Occurrences:   1    Standing Expiration Date:   05/22/2021  . Lactate dehydrogenase (LDH)    Standing Status:   Future    Number of Occurrences:   1    Standing Expiration Date:   05/22/2021  . Platelet by Citrate    Standing Status:   Future    Number of Occurrences:   1    Standing Expiration Date:   05/22/2021  . Save Smear (SS[facility])    Standing Status:   Future    Number of Occurrences:   1    Standing Expiration Date:   05/22/2021  . Vitamin B12    Standing Status:   Future    Number of Occurrences:   1    Standing Expiration Date:   05/22/2021  . Folate, Serum    Standing Status:   Future    Number of Occurrences:   1    Standing Expiration Date:   05/22/2021  . SPEP with reflex to IFE    Standing Status:   Future    Number of Occurrences:   1    Standing Expiration Date:   05/22/2021  . Kappa/lambda light chains    Standing Status:   Future    Number of Occurrences:   1    Standing Expiration Date:   05/22/2021  . Hepatitis B surface antigen    Standing Status:   Future    Number of Occurrences:   1    Standing Expiration Date:   05/22/2021  . Hepatitis C antibody    Standing Status:   Future    Number of Occurrences:   1    Standing Expiration Date:   05/22/2021  . Hepatitis B surface antibody    Standing Status:   Future    Number of Occurrences:   1    Standing Expiration Date:   05/22/2021    All questions were  answered. The patient knows to call the clinic with any problems, questions or concerns.  A total of more than 60 minutes were spent on this encounter and over half of that time was spent on counseling and coordination of care as outlined above.   Ledell Peoples, MD Department of Hematology/Oncology [place] [facility] Phone: [phone] Pager: [phone] Email: [email or web address]  05/28/2020 7:46 PM

## 2020-05-25 DIAGNOSIS — Z23 Encounter for immunization: Secondary | ICD-10-CM | POA: Diagnosis not present

## 2020-05-26 LAB — PROTEIN ELECTROPHORESIS, SERUM, WITH REFLEX
A/G Ratio: 1.5 (ref 0.7–1.7)
Albumin ELP: 3.8 g/dL (ref 2.9–4.4)
Alpha-1-Globulin: 0.3 g/dL (ref 0.0–0.4)
Alpha-2-Globulin: 0.7 g/dL (ref 0.4–1.0)
Beta Globulin: 1.1 g/dL (ref 0.7–1.3)
Gamma Globulin: 0.5 g/dL (ref 0.4–1.8)
Globulin, Total: 2.5 g/dL (ref 2.2–3.9)
Total Protein ELP: 6.3 g/dL (ref 6.0–8.5)

## 2020-05-26 LAB — KAPPA/LAMBDA LIGHT CHAINS
Kappa free light chain: 19.4 mg/L (ref 3.3–19.4)
Kappa, lambda light chain ratio: 2.23 — ABNORMAL HIGH (ref 0.26–1.65)
Lambda free light chains: 8.7 mg/L (ref 5.7–26.3)

## 2020-06-05 ENCOUNTER — Ambulatory Visit: Payer: Medicare Other

## 2020-07-07 ENCOUNTER — Other Ambulatory Visit: Payer: Self-pay

## 2020-07-07 ENCOUNTER — Ambulatory Visit
Admission: RE | Admit: 2020-07-07 | Discharge: 2020-07-07 | Disposition: A | Discharge status: Home / Self Care | Payer: Medicare Other | Source: Ambulatory Visit | Attending: Family Medicine | Admitting: Family Medicine

## 2020-07-07 DIAGNOSIS — Z1231 Encounter for screening mammogram for malignant neoplasm of breast: Secondary | ICD-10-CM | POA: Diagnosis not present

## 2020-07-31 IMAGING — CT CT ABD-PELV W/ CM
2 of 5 series · 14 of 46 positions shown, 16 images · IV contrast (iopamidol)
Comparison: CT abdomen pelvis 05/25/2014 MR abdomen 10/10/2014

CLINICAL DATA: Abdominal pain radiating to back, history of anal
fissure, vaginal repair, incontinence and appendectomy. Prior basal
cell carcinoma.

EXAM:
CT ABDOMEN AND PELVIS WITH CONTRAST
TECHNIQUE: Multidetector CT imaging of the abdomen and pelvis was performed
using the standard protocol following bolus administration of
intravenous contrast.
CONTRAST:  80mL [id number] IOPAMIDOL ([id number]) INJECTION 61%

[Series 2: abd pelvis 5.00 br40 s3 axial · axial · 0.64mm/px · z∈[+1241,+1631]mm · 11 of 88 slices shown, 13 images]
[im 5/88  soft-tissue]
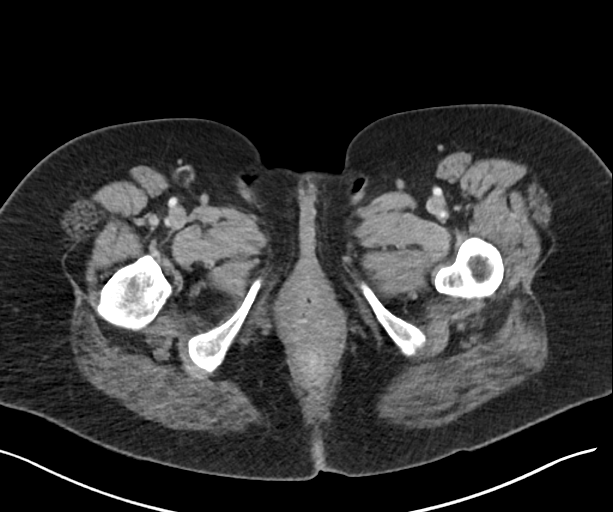
[im 5/88  bone]
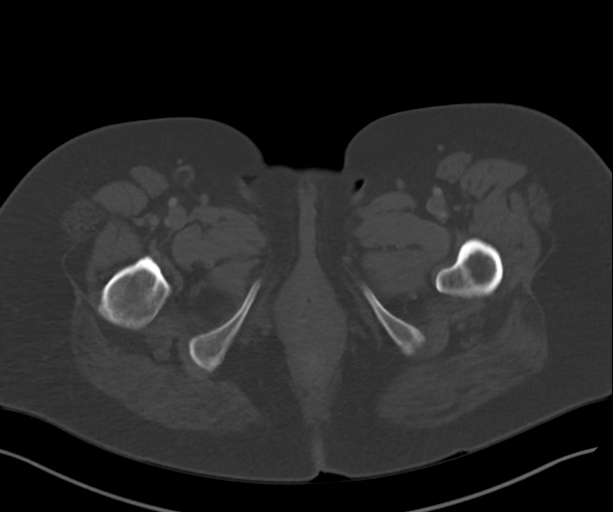
[im 14/88  soft-tissue]
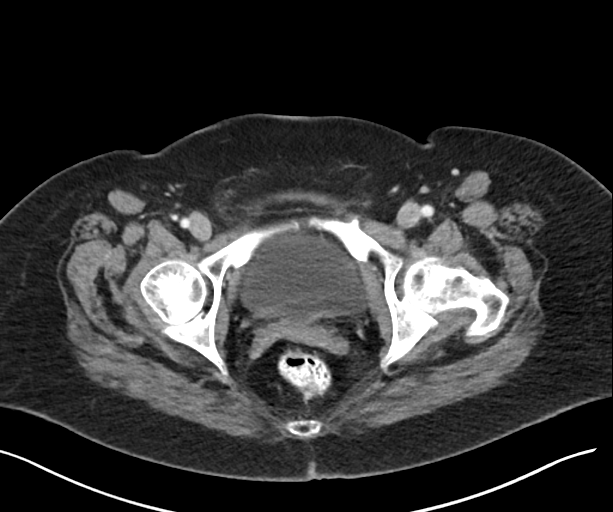
[im 22/88  soft-tissue]
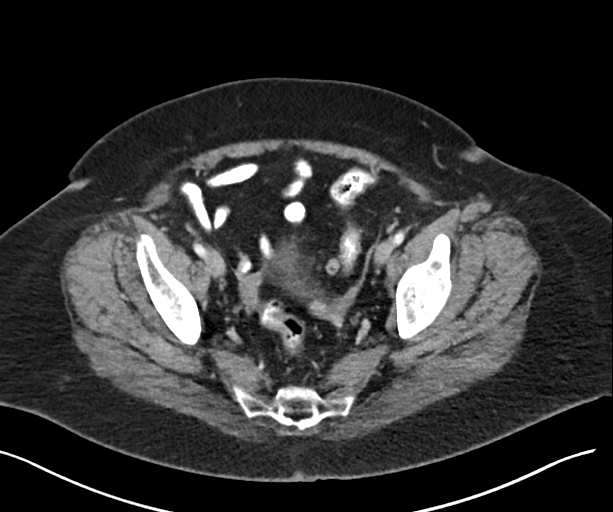
[im 31/88  soft-tissue]
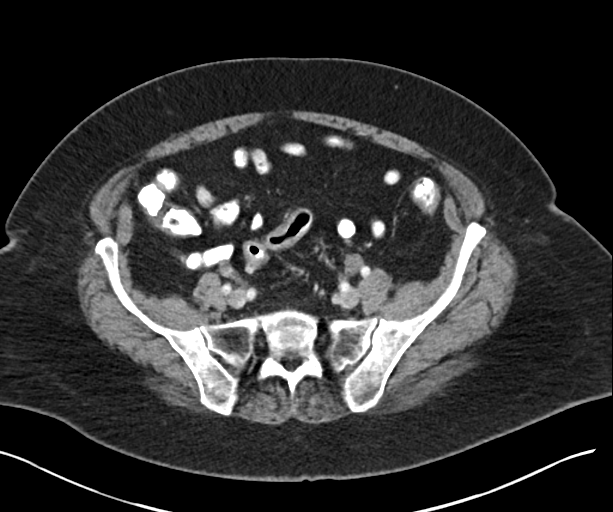
[im 35/88  soft-tissue]
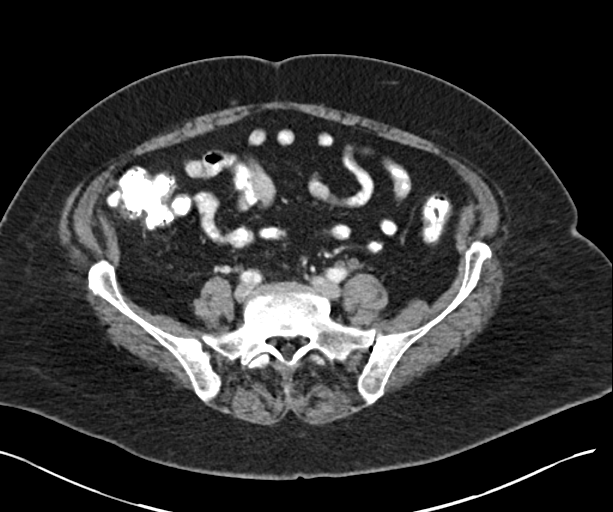
[im 44/88  soft-tissue]
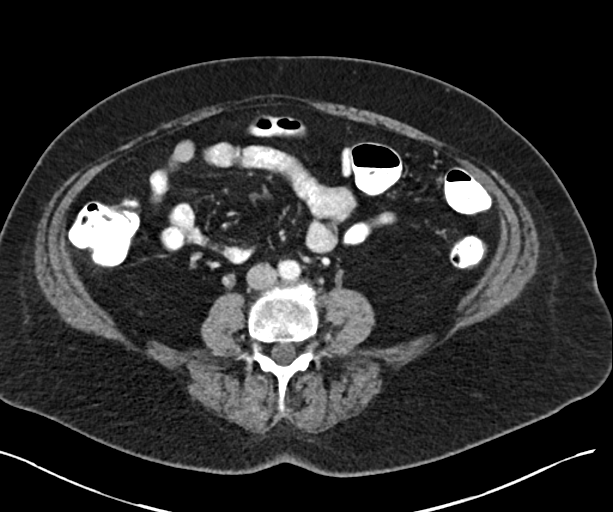
[im 53/88  soft-tissue]
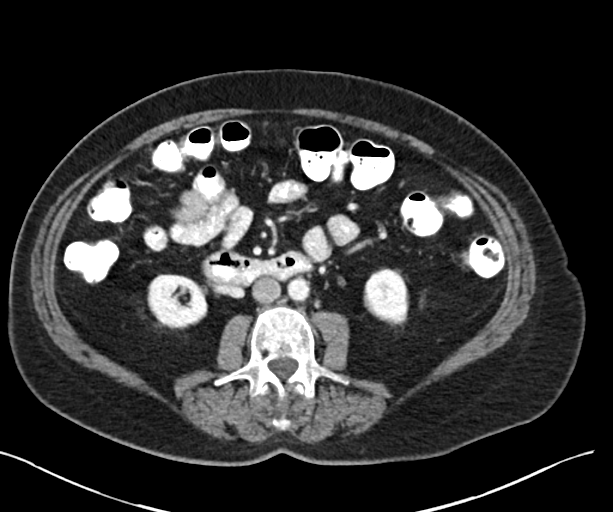
[im 57/88  soft-tissue]
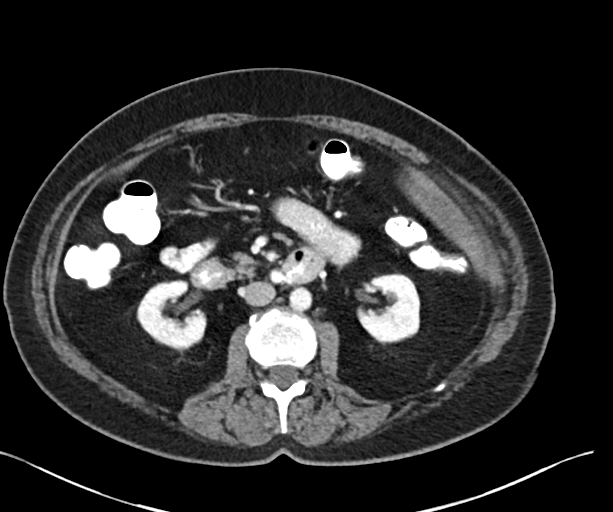
[im 66/88  soft-tissue]
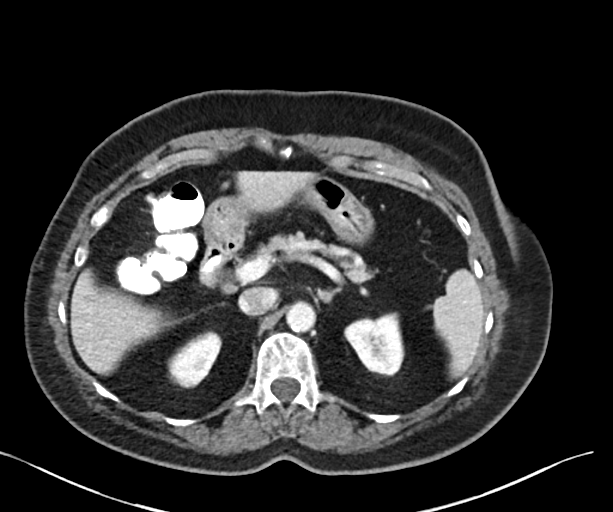
[im 66/88  bone]
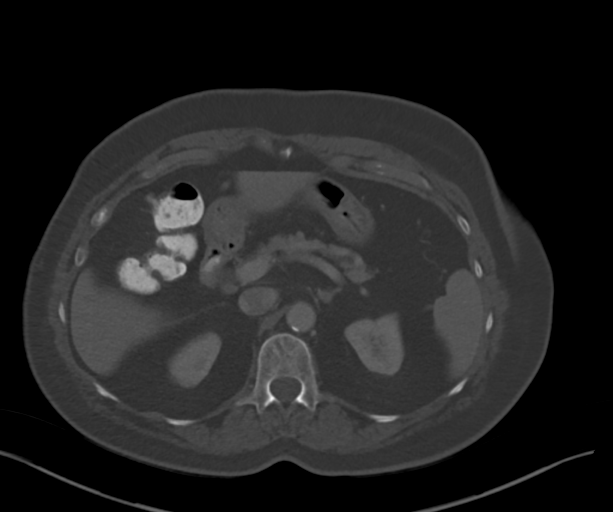
[im 74/88  soft-tissue]
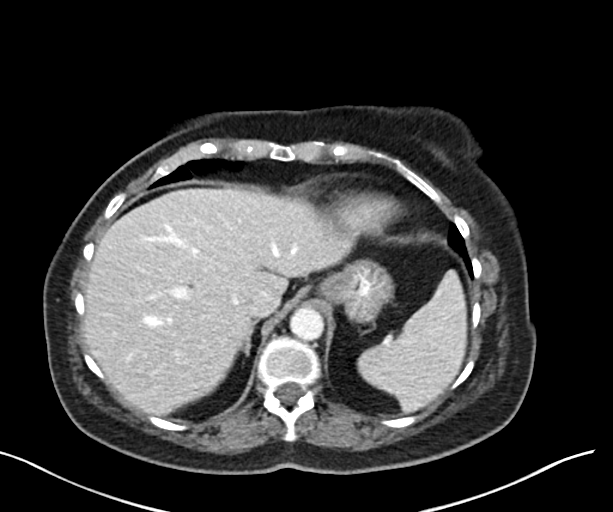
[im 83/88  soft-tissue]
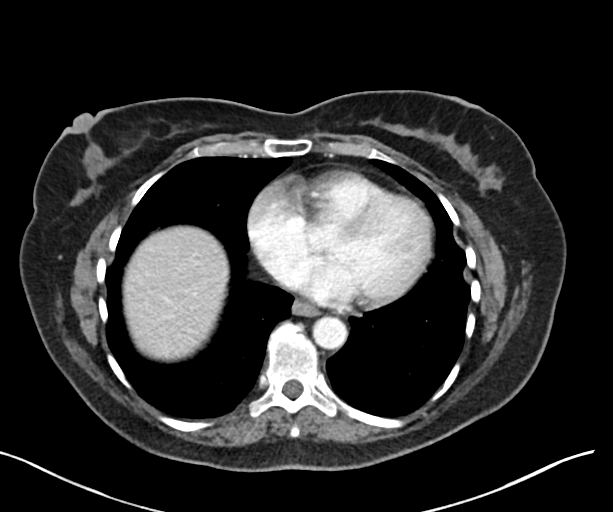

[Series 6: abd pelvis 2.00 br40 s3 cor · coronal · 0.77mm/px · 3 of 163 slices shown]
[im 55/163  soft-tissue]
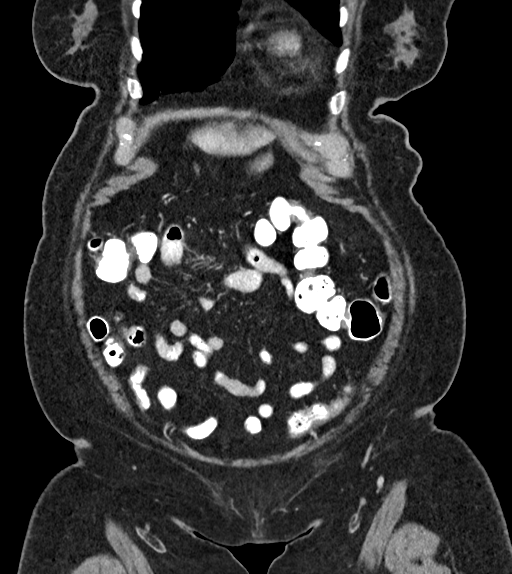
[im 73/163  soft-tissue]
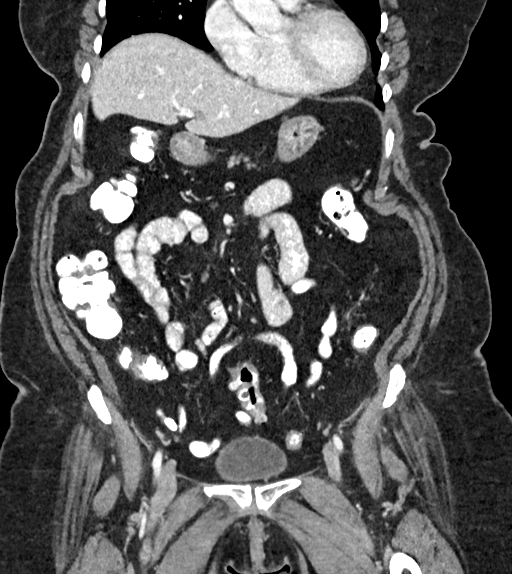
[im 91/163  soft-tissue]
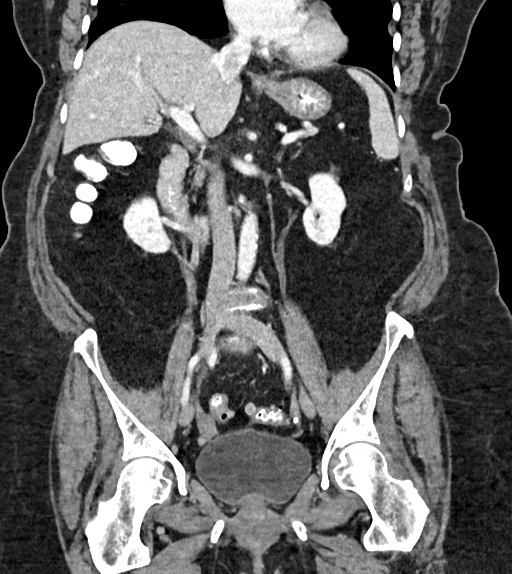

[14 of 46 positions shown; findings below may reference images not displayed]

FINDINGS: Lower chest: Lung bases are clear. Normal heart size. No pericardial
effusion.

Hepatobiliary: Subcentimeter hypoattenuating focus in the anterior
left lobe liver ([DATE]) too small to fully characterize on CT imaging
but statistically likely benign. No focal concerning hepatic
lesions. Patient is post cholecystectomy. No biliary ductal
dilatation or calcified intraductal gallstones.

Pancreas: Mild pancreatic atrophy. Stable 1 cm cystic focus in the
uncinate process. No peripancreatic inflammation or ductal
dilatation.

Spleen: Normal in size without focal abnormality.

Adrenals/Urinary Tract: Normal adrenal glands. Mild bilateral
symmetric perinephric stranding, a nonspecific finding though may
correlate with either age or decreased renal function. No visible or
contour deforming renal lesions. No urolithiasis or hydronephrosis.
Urinary bladder is unremarkable.

Stomach/Bowel: Distal esophagus, stomach and duodenal sweep are
unremarkable. No small bowel wall thickening or dilatation. No
evidence of obstruction. The appendix is surgically absent.
Scattered colonic diverticula without focal pericolonic inflammation
to suggest diverticulitis. No colonic dilatation or wall thickening.

Vascular/Lymphatic: Atherosclerotic plaque within the normal caliber
aorta. No suspicious or enlarged lymph nodes in the included
lymphatic chains.

Reproductive: Uterus is surgically absent. No concerning adnexal
lesions.

Other: No abdominopelvic free fluid or free gas. No bowel containing
hernias.

Musculoskeletal: Multilevel degenerative changes are present in the
imaged portions of the spine. Additional degenerative changes in the
pelvis and hips. No acute osseous abnormality or suspicious osseous
lesion.
IMPRESSION: No acute intra-abdominal process to provide cause for patient's
symptoms.

Diverticulosis without evidence of diverticulitis.

Post cholecystectomy, appendectomy, hysterectomy.

Stable 1 cm cystic lesion in the head uncinate process of the
pancreas, almost certainly benign.

## 2020-08-22 ENCOUNTER — Ambulatory Visit
Admission: RE | Admit: 2020-08-22 | Discharge: 2020-08-22 | Disposition: A | Discharge status: Home / Self Care | Payer: Medicare Other | Source: Ambulatory Visit | Attending: Family Medicine | Admitting: Family Medicine

## 2020-08-22 ENCOUNTER — Other Ambulatory Visit: Payer: Self-pay

## 2020-08-22 DIAGNOSIS — M8580 Other specified disorders of bone density and structure, unspecified site: Secondary | ICD-10-CM

## 2020-09-30 DIAGNOSIS — H04123 Dry eye syndrome of bilateral lacrimal glands: Secondary | ICD-10-CM | POA: Diagnosis not present

## 2020-09-30 DIAGNOSIS — H25811 Combined forms of age-related cataract, right eye: Secondary | ICD-10-CM | POA: Diagnosis not present

## 2020-09-30 DIAGNOSIS — H401131 Primary open-angle glaucoma, bilateral, mild stage: Secondary | ICD-10-CM | POA: Diagnosis not present

## 2020-11-03 DIAGNOSIS — Z6830 Body mass index (BMI) 30.0-30.9, adult: Secondary | ICD-10-CM | POA: Diagnosis not present

## 2020-11-03 DIAGNOSIS — R7303 Prediabetes: Secondary | ICD-10-CM | POA: Diagnosis not present

## 2020-11-03 DIAGNOSIS — G43909 Migraine, unspecified, not intractable, without status migrainosus: Secondary | ICD-10-CM | POA: Diagnosis not present

## 2020-11-03 DIAGNOSIS — G3184 Mild cognitive impairment, so stated: Secondary | ICD-10-CM | POA: Diagnosis not present

## 2020-11-03 DIAGNOSIS — M8589 Other specified disorders of bone density and structure, multiple sites: Secondary | ICD-10-CM | POA: Diagnosis not present

## 2020-11-03 DIAGNOSIS — D696 Thrombocytopenia, unspecified: Secondary | ICD-10-CM | POA: Diagnosis not present

## 2020-11-03 DIAGNOSIS — N183 Chronic kidney disease, stage 3 unspecified: Secondary | ICD-10-CM | POA: Diagnosis not present

## 2020-11-03 DIAGNOSIS — E039 Hypothyroidism, unspecified: Secondary | ICD-10-CM | POA: Diagnosis not present

## 2020-11-03 DIAGNOSIS — F5101 Primary insomnia: Secondary | ICD-10-CM | POA: Diagnosis not present

## 2020-11-03 DIAGNOSIS — E7800 Pure hypercholesterolemia, unspecified: Secondary | ICD-10-CM | POA: Diagnosis not present

## 2020-11-03 DIAGNOSIS — G629 Polyneuropathy, unspecified: Secondary | ICD-10-CM | POA: Diagnosis not present

## 2020-11-03 DIAGNOSIS — R030 Elevated blood-pressure reading, without diagnosis of hypertension: Secondary | ICD-10-CM | POA: Diagnosis not present

## 2020-11-05 ENCOUNTER — Telehealth: Payer: Self-pay | Admitting: *Deleted

## 2020-11-05 NOTE — Telephone Encounter (Signed)
Received call from Dr. Irma Newness office regarding this patient. Dr. Sabra Heck is wondering what the plan is regarding pt's low platelets. Pt was seen here in October of 2021. Labs done. No follow up appt noted  Dr. Lorenso Courier please advise.  Dr. Kathyrn Lass requests a call back

## 2020-11-10 ENCOUNTER — Telehealth: Payer: Self-pay | Admitting: Hematology and Oncology

## 2020-11-10 NOTE — Telephone Encounter (Signed)
Scheduled appts per 3/28 sch msg. Pt aware.  

## 2020-11-11 NOTE — Telephone Encounter (Signed)
Please let Dr. Sabra Heck know we will be getting this patient in shortly to resume her workup.

## 2020-11-12 ENCOUNTER — Telehealth: Payer: Self-pay | Admitting: *Deleted

## 2020-11-12 NOTE — Telephone Encounter (Signed)
Received call from Dr. Lattie Haw miller's office. Advised that Dr. Lorenso Courier will be seeing this patient on 11/26/20.  Office staff will let Dr. Sabra Heck know of this.

## 2020-11-17 DIAGNOSIS — H0289 Other specified disorders of eyelid: Secondary | ICD-10-CM | POA: Diagnosis not present

## 2020-11-25 ENCOUNTER — Other Ambulatory Visit: Payer: Self-pay | Admitting: Hematology and Oncology

## 2020-11-25 DIAGNOSIS — D696 Thrombocytopenia, unspecified: Secondary | ICD-10-CM

## 2020-11-25 NOTE — Progress Notes (Signed)
[place] [phone]   [phone]  PROGRESS NOTE  Patient Care Team: Kathyrn Lass, MD as PCP - General (Family Medicine)  Hematological/Oncological History # Thrombocytopenia, Unclear Etiology 05/25/2014: WBC 8.2, Hgb 14.4, MCV 81.3, Plt 162 06/17/2015: WBC 5.5, Hgb 13.9, MCV 81, Plt 124 10/25/2018: WBC 6.1, Hgb 13.4, MCV 81.8, Plt 147 05/01/2020: WBC 6.5, Hgb 13.8, MCV 78.2, Plt 79 05/22/2020: establish care with Dr. Lorenso Courier   Interval History:  [name] 86 y.o. female with medical history significant for thrombocytopenia of unclear etiology presents for a follow up visit. The patient's last visit was on 05/22/2020 at which time she established care. In the interim since the last visit Mrs. Haydon has had continued low Plts.  On exam today Mrs. Dudas reports she has been well in the interim since her last visit back in October 2021.  She is not had any issues with bleeding, bruising, or dark stools.  She notes that her appetite has been good and she has not had any issues with fatigue, weight loss, or shortness of breath.  She currently denies any fevers, chills, sweats, nausea, running or diarrhea.  A full 10 point ROS is listed below.  MEDICAL HISTORY:  Past Medical History:  Diagnosis Date  . CKD (chronic kidney disease), stage III ([place])   . Familial hypercholesterolemia   . GERD (gastroesophageal reflux disease)   . Glaucoma   . Hypothyroid   . Migraine headache   . Post herpetic neuralgia     SURGICAL HISTORY: Past Surgical History:  Procedure Laterality Date  . ANAL FISSURE REPAIR    . APPENDECTOMY    . BASAL CELL CARCINOMA EXCISION    . cataract surgery     left  . CHOLECYSTECTOMY    . hemithyroidectomy    . INCONTINENCE SURGERY    . THYROIDECTOMY    . TUBAL LIGATION    . VAGINAL HYSTERECTOMY    . vaginal vault repair      SOCIAL HISTORY: Social History   Socioeconomic History  . Marital status: Married     Spouse name: Nadara Mustard  . Number of children: 4  . Years of education: 65  . Highest education level: Not on file  Occupational History  . Not on file  Tobacco Use  . Smoking status: Never Smoker  . Smokeless tobacco: Never Used  Substance and Sexual Activity  . Alcohol use: Not on file  . Drug use: Not on file  . Sexual activity: Not on file  Other Topics Concern  . Not on file  Social History Narrative  . Not on file   Social Determinants of Health   Financial Resource Strain: Not on file  Food Insecurity: Not on file  Transportation Needs: Not on file  Physical Activity: Not on file  Stress: Not on file  Social Connections: Not on file  Intimate Partner Violence: Not on file    FAMILY HISTORY: Family History  Problem Relation Age of Onset  . Heart disease Mother        chf  . Heart disease Sister        CABG    ALLERGIES:  is allergic to amitriptyline, latex, meperidine, estradiol, sulfa antibiotics, vioxx [rofecoxib], augmentin [amoxicillin-pot clavulanate], lipitor [atorvastatin], motrin [ibuprofen], and rosuvastatin.  MEDICATIONS:  Current Outpatient Medications  Medication Sig Dispense Refill  . escitalopram (LEXAPRO) 5 MG tablet Take by mouth.    [name] acetaminophen (TYLENOL) 500 MG tablet 1 tablet as needed    .  butalbital-acetaminophen-caffeine (FIORICET, ESGIC) 50-325-40 MG tablet take 1 tablet by mouth if needed for headache, no more than 1 a day  0  . carboxymethylcellulose (REFRESH PLUS) 0.5 % SOLN Apply to eye.    . Cholecalciferol 25 MCG (1000 UT) capsule Take by mouth.    . donepezil (ARICEPT) 10 MG tablet Take 1 tablet (10 mg total) by mouth at bedtime. 90 tablet 3  . erythromycin ophthalmic ointment Place into the right eye 4 (four) times daily.    [name] ezetimibe (ZETIA) 10 MG tablet Take 10 mg by mouth daily.    [name] gabapentin (NEURONTIN) 300 MG capsule Take 300 mg by mouth 3 (three) times daily.    [name] levocetirizine (XYZAL) 5 MG tablet TAKE 1 TABLET BY  MOUTH EVERY EVENING 30 tablet 5  . levothyroxine (SYNTHROID, LEVOTHROID) 75 MCG tablet Take 75 mcg by mouth daily before breakfast.    . LUMIGAN 0.01 % SOLN INSTILL 1 GTT INTO OU NIGHTLY  6  . MYRBETRIQ 50 MG TB24 tablet Take 50 mg by mouth daily.    . timolol (TIMOPTIC) 0.25 % ophthalmic solution instill 1 drop into both eyes once daily  0  . tizanidine (ZANAFLEX) 2 MG capsule Take 2 mg by mouth 3 (three) times daily as needed for muscle spasms.     No current facility-administered medications for this visit.    REVIEW OF SYSTEMS:   Constitutional: ( - ) fevers, ( - )  chills , ( - ) night sweats Eyes: ( - ) blurriness of vision, ( - ) double vision, ( - ) watery eyes Ears, nose, mouth, throat, and face: ( - ) mucositis, ( - ) sore throat Respiratory: ( - ) cough, ( - ) dyspnea, ( - ) wheezes Cardiovascular: ( - ) palpitation, ( - ) chest discomfort, ( - ) lower extremity swelling Gastrointestinal:  ( - ) nausea, ( - ) heartburn, ( - ) change in bowel habits Skin: ( - ) abnormal skin rashes Lymphatics: ( - ) new lymphadenopathy, ( - ) easy bruising Neurological: ( - ) numbness, ( - ) tingling, ( - ) new weaknesses Behavioral/Psych: ( - ) mood change, ( - ) new changes  All other systems were reviewed with the patient and are negative.  PHYSICAL EXAMINATION:  Vitals:   11/26/20 1057  BP: (!) 153/61  Pulse: 62  Resp: 18  Temp: 98.3 F (36.8 C)  SpO2: 97%   Filed Weights   11/26/20 1057  Weight: 161 lb 1.6 oz (73.1 kg)    GENERAL: alert, no distress and comfortable SKIN: skin color, texture, turgor are normal, no rashes or significant lesions EYES: conjunctiva are pink and non-injected, sclera clear LUNGS: clear to auscultation and percussion with normal breathing effort HEART: regular rate & rhythm and no murmurs and no lower extremity edema Musculoskeletal: no cyanosis of digits and no clubbing  PSYCH: alert & oriented x 3, fluent speech NEURO: no focal motor/sensory  deficits  LABORATORY DATA:  I have reviewed the data as listed CBC Latest Ref Rng & Units 11/26/2020 05/22/2020 10/25/2018  WBC 4.0 - 10.5 K/uL 4.2 5.6 6.1  Hemoglobin 12.0 - 15.0 g/dL 12.1 12.4 13.4  Hematocrit 36.0 - 46.0 % 38.2 38.6 40.5  Platelets 150 - 400 K/uL 109(L) 67(L) 147.0(L)    CMP Latest Ref Rng & Units 11/26/2020 05/22/2020 06/17/2015  Glucose 70 - 99 mg/dL 109(H) 111(H) 98  BUN 8 - 23 mg/dL '15 17 14  ' Creatinine 0.44 - 1.00  mg/dL 1.33(H) 1.24(H) 1.01(H)  Sodium 135 - 145 mmol/L 142 141 141  Potassium 3.5 - 5.1 mmol/L 4.3 3.9 4.2  Chloride 98 - 111 mmol/L 104 105 99  CO2 22 - 32 mmol/L 27 32 22  Calcium 8.9 - 10.3 mg/dL 9.1 9.5 9.6  Total Protein 6.5 - 8.1 g/dL 6.5 6.6 6.6  Total Bilirubin 0.3 - 1.2 mg/dL 0.5 0.3 0.3  Alkaline Phos 38 - 126 U/L 101 108 122(H)  AST 15 - 41 U/L '15 17 19  ' ALT 0 - 44 U/L '8 13 14    ' No results found for: MPROTEIN Lab Results  Component Value Date   KPAFRELGTCHN 19.4 05/22/2020   LAMBDASER 8.7 05/22/2020   KAPLAMBRATIO 2.23 (H) 05/22/2020    RADIOGRAPHIC STUDIES: No results found.  ASSESSMENT & PLAN AURIELLA WIEAND 86 y.o. female with medical history significant for thrombocytopenia of unclear etiology presents for a follow up visit.   After review the labs, review the records, discussion with the patient the findings are most consistent with cytopenia of unclear etiology.  She has had negative hepatitis serologies and a normal nutritional evaluation.  Additionally prior imaging it showed no evidence of liver disease or splenomegaly (07/04/2019).  As such the next step would be to perform a bone marrow biopsy, however the patient is hesitant and does not wish to proceed with that procedure at this time.  I would recommend that we follow-up with the patient in approximately 6 months time unless she would like to have the bone marrow biopsy performed in which case we can perform this sooner.  In the interim the patient were to have a  dramatic drop in her platelet count less than 30 we could try a steroid pulse for a presumed diagnosis of ITP.  # Thrombocytopenia, Unclear Etiology --negative hepatitis serologies and normal nutritional evaluation with vitamin B12 and folate --Platelet count by citrate as well as a review of peripheral blood film show no evidence of clumping as cause of thrombocytopenia --SPEP showed no M protein, however serum free light chains have an abnormal ratio. Will further evaluated with UPEP.  --Because no clear etiology can be discerned would recommend proceeding with bone marrow biopsy. Patient is hesitant and would like time to consider her options.  --possible diagnosis of ITP, which would be a diagnosis of exclusion. If Plts get <30 consider steroid pulse.  --RTC placeholder in 6 months, can see sooner if patient is agreeable to a bone marrow biopsy.   No orders of the defined types were placed in this encounter.   All questions were answered. The patient knows to call the clinic with any problems, questions or concerns.  A total of more than 30 minutes were spent on this encounter and over half of that time was spent on counseling and coordination of care as outlined above.   Ledell Peoples, MD Department of Hematology/Oncology [place] [facility] Phone: [phone] Pager: [phone] Email: [email or web address]  11/27/2020 1:58 PM

## 2020-11-26 ENCOUNTER — Inpatient Hospital Stay: Payer: Medicare Other | Attending: Hematology and Oncology

## 2020-11-26 ENCOUNTER — Other Ambulatory Visit: Payer: Self-pay

## 2020-11-26 ENCOUNTER — Inpatient Hospital Stay (HOSPITAL_BASED_OUTPATIENT_CLINIC_OR_DEPARTMENT_OTHER): Payer: Medicare Other | Admitting: Hematology and Oncology

## 2020-11-26 VITALS — BP 153/61 | HR 62 | Temp 98.3°F | Resp 18 | Ht 61.0 in | Wt 161.1 lb

## 2020-11-26 DIAGNOSIS — D696 Thrombocytopenia, unspecified: Secondary | ICD-10-CM | POA: Insufficient documentation

## 2020-11-26 DIAGNOSIS — N189 Chronic kidney disease, unspecified: Secondary | ICD-10-CM | POA: Insufficient documentation

## 2020-11-26 LAB — CBC WITH DIFFERENTIAL (CANCER CENTER ONLY)
Abs Immature Granulocytes: 0.02 10*3/uL (ref 0.00–0.07)
Basophils Absolute: 0.1 10*3/uL (ref 0.0–0.1)
Basophils Relative: 1 %
Eosinophils Absolute: 0.2 10*3/uL (ref 0.0–0.5)
Eosinophils Relative: 4 %
HCT: 38.2 % (ref 36.0–46.0)
Hemoglobin: 12.1 g/dL (ref 12.0–15.0)
Immature Granulocytes: 1 %
Lymphocytes Relative: 30 %
Lymphs Abs: 1.2 10*3/uL (ref 0.7–4.0)
MCH: 25 pg — ABNORMAL LOW (ref 26.0–34.0)
MCHC: 31.7 g/dL (ref 30.0–36.0)
MCV: 78.9 fL — ABNORMAL LOW (ref 80.0–100.0)
Monocytes Absolute: 0.3 10*3/uL (ref 0.1–1.0)
Monocytes Relative: 7 %
Neutro Abs: 2.4 10*3/uL (ref 1.7–7.7)
Neutrophils Relative %: 57 %
Platelet Count: 109 10*3/uL — ABNORMAL LOW (ref 150–400)
RBC: 4.84 MIL/uL (ref 3.87–5.11)
RDW: 14.5 % (ref 11.5–15.5)
WBC Count: 4.2 10*3/uL (ref 4.0–10.5)
nRBC: 0 % (ref 0.0–0.2)

## 2020-11-26 LAB — CMP (CANCER CENTER ONLY)
ALT: 8 U/L (ref 0–44)
AST: 15 U/L (ref 15–41)
Albumin: 3.9 g/dL (ref 3.5–5.0)
Alkaline Phosphatase: 101 U/L (ref 38–126)
Anion gap: 11 (ref 5–15)
BUN: 15 mg/dL (ref 8–23)
CO2: 27 mmol/L (ref 22–32)
Calcium: 9.1 mg/dL (ref 8.9–10.3)
Chloride: 104 mmol/L (ref 98–111)
Creatinine: 1.33 mg/dL — ABNORMAL HIGH (ref 0.44–1.00)
GFR, Estimated: 39 mL/min — ABNORMAL LOW (ref >60)
Glucose, Bld: 109 mg/dL — ABNORMAL HIGH (ref 70–99)
Potassium: 4.3 mmol/L (ref 3.5–5.1)
Sodium: 142 mmol/L (ref 135–145)
Total Bilirubin: 0.5 mg/dL (ref 0.3–1.2)
Total Protein: 6.5 g/dL (ref 6.5–8.1)

## 2020-11-26 LAB — LACTATE DEHYDROGENASE: LDH: 178 U/L (ref 98–192)

## 2020-11-26 LAB — SAVE SMEAR(SSMR), FOR PROVIDER SLIDE REVIEW

## 2020-12-01 ENCOUNTER — Other Ambulatory Visit: Payer: Self-pay | Admitting: *Deleted

## 2020-12-01 ENCOUNTER — Telehealth: Payer: Self-pay | Admitting: Hematology and Oncology

## 2020-12-01 DIAGNOSIS — D696 Thrombocytopenia, unspecified: Secondary | ICD-10-CM

## 2020-12-01 DIAGNOSIS — N189 Chronic kidney disease, unspecified: Secondary | ICD-10-CM | POA: Diagnosis not present

## 2020-12-01 NOTE — Telephone Encounter (Signed)
Left message with follow-up appointment per 4/13 los. Gave option to call back to reschedule if needed.

## 2020-12-03 LAB — UPEP/UIFE/LIGHT CHAINS/TP, 24-HR UR
% BETA, Urine: 29.5 %
ALPHA 1 URINE: 5.4 %
Albumin, U: 39 %
Alpha 2, Urine: 13.2 %
Free Kappa Lt Chains,Ur: 16.93 mg/L (ref 1.17–86.46)
Free Kappa/Lambda Ratio: 4.76 (ref 1.83–14.26)
Free Lambda Lt Chains,Ur: 3.56 mg/L (ref 0.27–15.21)
GAMMA GLOBULIN URINE: 12.9 %
Total Protein, Urine-Ur/day: 53 mg/24 hr (ref 30–150)
Total Protein, Urine: 6.2 mg/dL
Total Volume: 850

## 2020-12-10 ENCOUNTER — Telehealth: Payer: Self-pay | Admitting: *Deleted

## 2020-12-10 ENCOUNTER — Other Ambulatory Visit: Payer: Self-pay | Admitting: *Deleted

## 2020-12-10 DIAGNOSIS — D696 Thrombocytopenia, unspecified: Secondary | ICD-10-CM

## 2020-12-10 NOTE — Telephone Encounter (Signed)
Received vm message from patient and is requesting a call back. Returned call to patient and spoke with her. She sates she has decided to go ahead with bone marrow biopsy after discussing this with her PCP'  Orders placed for CT guided bone marrow biopsy

## 2020-12-11 ENCOUNTER — Telehealth (HOSPITAL_COMMUNITY): Payer: Self-pay

## 2020-12-16 ENCOUNTER — Telehealth: Payer: Self-pay | Admitting: *Deleted

## 2020-12-16 DIAGNOSIS — F5101 Primary insomnia: Secondary | ICD-10-CM | POA: Diagnosis not present

## 2020-12-16 DIAGNOSIS — Z6830 Body mass index (BMI) 30.0-30.9, adult: Secondary | ICD-10-CM | POA: Diagnosis not present

## 2020-12-16 DIAGNOSIS — I10 Essential (primary) hypertension: Secondary | ICD-10-CM | POA: Diagnosis not present

## 2020-12-16 DIAGNOSIS — M8589 Other specified disorders of bone density and structure, multiple sites: Secondary | ICD-10-CM | POA: Diagnosis not present

## 2020-12-16 NOTE — Telephone Encounter (Signed)
Received call from patient seeking to clarify her appt for 12/25/20.She thought someone told her it was for a bone density exam.  Assured her that her appt that day is for a bone marrow biopsy.  Pt voiced understanding

## 2020-12-23 ENCOUNTER — Other Ambulatory Visit: Payer: Self-pay | Admitting: Student

## 2020-12-24 ENCOUNTER — Other Ambulatory Visit: Payer: Self-pay | Admitting: Radiology

## 2020-12-25 ENCOUNTER — Ambulatory Visit (HOSPITAL_COMMUNITY)
Admission: RE | Admit: 2020-12-25 | Discharge: 2020-12-25 | Disposition: A | Discharge status: Home / Self Care | Payer: Medicare Other | Source: Ambulatory Visit | Attending: Hematology and Oncology | Admitting: Hematology and Oncology

## 2020-12-25 ENCOUNTER — Observation Stay (HOSPITAL_COMMUNITY)
Admission: RE | Admit: 2020-12-25 | Discharge: 2020-12-25 | Disposition: A | Discharge status: Home / Self Care | Payer: Medicare Other | Source: Ambulatory Visit | Attending: Hematology and Oncology | Admitting: Hematology and Oncology

## 2020-12-25 ENCOUNTER — Other Ambulatory Visit: Payer: Self-pay

## 2020-12-25 ENCOUNTER — Encounter (HOSPITAL_COMMUNITY): Payer: Self-pay

## 2020-12-25 DIAGNOSIS — E7801 Familial hypercholesterolemia: Secondary | ICD-10-CM | POA: Insufficient documentation

## 2020-12-25 DIAGNOSIS — N183 Chronic kidney disease, stage 3 unspecified: Secondary | ICD-10-CM | POA: Insufficient documentation

## 2020-12-25 DIAGNOSIS — D704 Cyclic neutropenia: Secondary | ICD-10-CM | POA: Diagnosis not present

## 2020-12-25 DIAGNOSIS — Z79899 Other long term (current) drug therapy: Secondary | ICD-10-CM | POA: Diagnosis not present

## 2020-12-25 DIAGNOSIS — Z882 Allergy status to sulfonamides status: Secondary | ICD-10-CM | POA: Insufficient documentation

## 2020-12-25 DIAGNOSIS — Z91014 Allergy to mammalian meats: Secondary | ICD-10-CM | POA: Diagnosis not present

## 2020-12-25 DIAGNOSIS — K219 Gastro-esophageal reflux disease without esophagitis: Secondary | ICD-10-CM | POA: Diagnosis not present

## 2020-12-25 DIAGNOSIS — D6949 Other primary thrombocytopenia: Secondary | ICD-10-CM | POA: Insufficient documentation

## 2020-12-25 DIAGNOSIS — Z888 Allergy status to other drugs, medicaments and biological substances status: Secondary | ICD-10-CM | POA: Diagnosis not present

## 2020-12-25 DIAGNOSIS — E039 Hypothyroidism, unspecified: Secondary | ICD-10-CM | POA: Insufficient documentation

## 2020-12-25 DIAGNOSIS — Z8249 Family history of ischemic heart disease and other diseases of the circulatory system: Secondary | ICD-10-CM | POA: Insufficient documentation

## 2020-12-25 DIAGNOSIS — D696 Thrombocytopenia, unspecified: Secondary | ICD-10-CM

## 2020-12-25 DIAGNOSIS — Z79890 Hormone replacement therapy (postmenopausal): Secondary | ICD-10-CM | POA: Diagnosis not present

## 2020-12-25 LAB — CBC WITH DIFFERENTIAL/PLATELET
Abs Immature Granulocytes: 0.01 10*3/uL (ref 0.00–0.07)
Basophils Absolute: 0.1 10*3/uL (ref 0.0–0.1)
Basophils Relative: 2 %
Eosinophils Absolute: 0.2 10*3/uL (ref 0.0–0.5)
Eosinophils Relative: 4 %
HCT: 40.4 % (ref 36.0–46.0)
Hemoglobin: 12.4 g/dL (ref 12.0–15.0)
Immature Granulocytes: 0 %
Lymphocytes Relative: 37 %
Lymphs Abs: 1.9 10*3/uL (ref 0.7–4.0)
MCH: 24.8 pg — ABNORMAL LOW (ref 26.0–34.0)
MCHC: 30.7 g/dL (ref 30.0–36.0)
MCV: 81 fL (ref 80.0–100.0)
Monocytes Absolute: 0.5 10*3/uL (ref 0.1–1.0)
Monocytes Relative: 9 %
Neutro Abs: 2.5 10*3/uL (ref 1.7–7.7)
Neutrophils Relative %: 48 %
Platelets: 114 10*3/uL — ABNORMAL LOW (ref 150–400)
RBC: 4.99 MIL/uL (ref 3.87–5.11)
RDW: 14.5 % (ref 11.5–15.5)
WBC: 5.2 10*3/uL (ref 4.0–10.5)
nRBC: 0 % (ref 0.0–0.2)

## 2020-12-25 MED ORDER — FENTANYL CITRATE (PF) 100 MCG/2ML IJ SOLN
INTRAMUSCULAR | Status: AC | PRN
  Administered 2020-12-25 (×2): 50 ug via INTRAVENOUS

## 2020-12-25 MED ORDER — LIDOCAINE HCL (PF) 1 % IJ SOLN
INTRAMUSCULAR | Status: AC | PRN
  Administered 2020-12-25: 10 mL

## 2020-12-25 MED ORDER — MIDAZOLAM HCL 2 MG/2ML IJ SOLN
INTRAMUSCULAR | Status: AC
  Filled 2020-12-25: qty 4

## 2020-12-25 MED ORDER — FENTANYL CITRATE (PF) 100 MCG/2ML IJ SOLN
INTRAMUSCULAR | Status: AC
  Filled 2020-12-25: qty 2

## 2020-12-25 MED ORDER — MIDAZOLAM HCL 2 MG/2ML IJ SOLN
INTRAMUSCULAR | Status: AC | PRN
  Administered 2020-12-25 (×2): 1 mg via INTRAVENOUS

## 2020-12-25 MED ORDER — SODIUM CHLORIDE 0.9 % IV SOLN: INTRAVENOUS | Status: DC

## 2020-12-25 NOTE — Procedures (Signed)
Pre-procedure Diagnosis: Progressive thrombocytopenia  Post-procedure Diagnosis: Same  Technically successful CT guided bone marrow aspiration and biopsy of left iliac crest.   Complications: None Immediate  EBL: None  Signed: Sandi Mariscal Pager: [phone] 12/25/2020, 9:18 AM

## 2020-12-25 NOTE — H&P (Signed)
Chief Complaint: Patient was seen in consultation today for thrombocytopenia/bone marrow biopsy and aspiration.  Referring Physician(s): Dorsey,John T IV (oncology)  Supervising Physician: Sandi Mariscal  Patient Status: [facility] - Out-pt  History of Present Illness: Brittney [name] is a 86 y.o. female with a past medical history of familial hypercholesterolemia, migraines, GERD, CKD stage III, hypothyroidism, and post-herpetic neuralgia. She has had progressive thrombocytopenia for years (etiology unknown) and was referred to oncology for further management who recommended IR consult for possible bone marrow biopsy/aspiration for further evaluation.  IR consulted by Dr. Lorenso Courier for possible image-guided bone marrow biopsy/aspiration. Patient awake and alert laying in bed with no complaints at this time. Denies fever, chills, chest pain, dyspnea, abdominal pain, or headache.   Past Medical History:  Diagnosis Date  . CKD (chronic kidney disease), stage III ([place])   . Familial hypercholesterolemia   . GERD (gastroesophageal reflux disease)   . Glaucoma   . Hypothyroid   . Migraine headache   . Post herpetic neuralgia     Past Surgical History:  Procedure Laterality Date  . ANAL FISSURE REPAIR    . APPENDECTOMY    . BASAL CELL CARCINOMA EXCISION    . cataract surgery     left  . CHOLECYSTECTOMY    . hemithyroidectomy    . INCONTINENCE SURGERY    . THYROIDECTOMY    . TUBAL LIGATION    . VAGINAL HYSTERECTOMY    . vaginal vault repair      Allergies: Amitriptyline, Latex, Meperidine, Estradiol, Sulfa antibiotics, Vioxx [rofecoxib], Augmentin [amoxicillin-pot clavulanate], Lipitor [atorvastatin], Motrin [ibuprofen], and Rosuvastatin  Medications: Prior to Admission medications   Medication Sig Start Date End Date Taking? Authorizing Provider  acetaminophen (TYLENOL) 500 MG tablet 1 tablet as needed   Yes [provider]  butalbital-acetaminophen-caffeine (FIORICET,  ESGIC) 50-325-40 MG tablet take 1 tablet by mouth if needed for headache, no more than 1 a day 01/09/16  Yes [provider]  donepezil (ARICEPT) 10 MG tablet Take 1 tablet (10 mg total) by mouth at bedtime. 11/03/17  Yes Dohmeier, Asencion Partridge, MD  escitalopram (LEXAPRO) 5 MG tablet Take by mouth. 11/03/20  Yes [provider]  gabapentin (NEURONTIN) 300 MG capsule Take 300 mg by mouth 3 (three) times daily. 05/05/20  Yes [provider]  levocetirizine (XYZAL) 5 MG tablet TAKE 1 TABLET BY MOUTH EVERY EVENING 06/21/19  Yes Olalere, Adewale A, MD  levothyroxine (SYNTHROID, LEVOTHROID) 75 MCG tablet Take 75 mcg by mouth daily before breakfast.   Yes [provider]  LUMIGAN 0.01 % SOLN INSTILL 1 GTT INTO OU NIGHTLY 10/25/17  Yes [provider]  MYRBETRIQ 50 MG TB24 tablet Take 50 mg by mouth daily. 05/03/20  Yes [provider]  timolol (TIMOPTIC) 0.25 % ophthalmic solution instill 1 drop into both eyes once daily 12/29/15  Yes [provider]  carboxymethylcellulose (REFRESH PLUS) 0.5 % SOLN Apply to eye.    [provider]  Cholecalciferol 25 MCG (1000 UT) capsule Take by mouth.    [provider]  erythromycin ophthalmic ointment Place into the right eye 4 (four) times daily. 11/17/20   [provider]  ezetimibe (ZETIA) 10 MG tablet Take 10 mg by mouth daily.    [provider]  tizanidine (ZANAFLEX) 2 MG capsule Take 2 mg by mouth 3 (three) times daily as needed for muscle spasms.    [provider]     Family History  Problem Relation Age of Onset  .  Heart disease Mother        chf  . Heart disease Sister        CABG    Social History   Socioeconomic History  . Marital status: Married    Spouse name: Nadara Mustard  . Number of children: 4  . Years of education: 70  . Highest education level: Not on file  Occupational History  . Not on file  Tobacco Use  . Smoking status: Never Smoker  .  Smokeless tobacco: Never Used  Substance and Sexual Activity  . Alcohol use: Not on file  . Drug use: Not on file  . Sexual activity: Not on file  Other Topics Concern  . Not on file  Social History Narrative  . Not on file   Social Determinants of Health   Financial Resource Strain: Not on file  Food Insecurity: Not on file  Transportation Needs: Not on file  Physical Activity: Not on file  Stress: Not on file  Social Connections: Not on file     Review of Systems: A 12 point ROS discussed and pertinent positives are indicated in the HPI above.  All other systems are negative.  Review of Systems  Constitutional: Negative for chills and fever.  Respiratory: Negative for shortness of breath and wheezing.   Cardiovascular: Negative for chest pain and palpitations.  Gastrointestinal: Negative for abdominal pain.  Neurological: Negative for headaches.  Psychiatric/Behavioral: Negative for behavioral problems and confusion.    Vital Signs: BP (!) 171/64   Pulse 66   Temp 98 F (36.7 C) (Oral)   Resp 16   Ht 5' 1" (1.549 m)   Wt 162 lb (73.5 kg)   SpO2 100%   BMI 30.61 kg/m   Physical Exam Vitals and nursing note reviewed.  Constitutional:      General: She is not in acute distress. Cardiovascular:     Rate and Rhythm: Normal rate and regular rhythm.     Heart sounds: Normal heart sounds. No murmur heard.   Pulmonary:     Effort: Pulmonary effort is normal. No respiratory distress.     Breath sounds: Normal breath sounds. No wheezing.  Skin:    General: Skin is warm and dry.  Neurological:     Mental Status: She is alert and oriented to person, place, and time.      MD Evaluation Airway: WNL Heart: WNL Abdomen: WNL Chest/ Lungs: WNL ASA  Classification: 2 Mallampati/Airway Score: Two   Imaging: No results found.  Labs:  CBC: Recent Labs    05/22/20 1349 11/26/20 1033 12/25/20 0730  WBC 5.6 4.2 5.2  HGB 12.4 12.1 12.4  HCT 38.6 38.2 40.4   PLT 67* 109* 114*    COAGS: No results for input(s): INR, APTT in the last 8760 hours.  BMP: Recent Labs    05/22/20 1349 11/26/20 1033  NA 141 142  K 3.9 4.3  CL 105 104  CO2 32 27  GLUCOSE 111* 109*  BUN 17 15  CALCIUM 9.5 9.1  CREATININE 1.24* 1.33*  GFRNONAA 40* 39*    LIVER FUNCTION TESTS: Recent Labs    05/22/20 1349 11/26/20 1033  BILITOT 0.3 0.5  AST 17 15  ALT 13 8  ALKPHOS 108 101  PROT 6.6 6.5  ALBUMIN 3.8 3.9     Assessment and Plan:  Thrombocytopenia. Plan for image-guided bone marrow biopsy/aspiration today in IR. Patient is NPO. Afebrile. CBC with differential ordered for this AM.  Risks and benefits discussed  with the patient including, but not limited to bleeding, infection, damage to adjacent structures or low yield requiring additional tests. All of the patient's questions were answered, patient is agreeable to proceed. Consent signed and in chart.   Thank you for this interesting consult.  I greatly enjoyed meeting Brittney [name] and look forward to participating in their care.  A copy of this report was sent to the requesting provider on this date.  Electronically Signed: Earley Abide, PA-C 12/25/2020, 8:19 AM   I spent a total of 15 Minutes in face to face in clinical consultation, greater than 50% of which was counseling/coordinating care for thrombocytopenia/bone marrow biopsy and aspiration.

## 2020-12-25 NOTE — Discharge Instructions (Signed)
For questions /concerns may call Interventional Radiology at [phone]  You may remove your dressing and shower tomorrow afternoon    Bone Marrow Aspiration and Bone Marrow Biopsy, Adult, Care After This sheet gives you information about how to care for yourself after your procedure. Your health care provider may also give you more specific instructions. If you have problems or questions, contact your health care provider. What can I expect after the procedure? After the procedure, it is common to have:  Mild pain and tenderness.  Swelling.  Bruising. Follow these instructions at home: Puncture site care  Follow instructions from your health care provider about how to take care of the puncture site. Make sure you: ? Wash your hands with soap and water before and after you change your bandage (dressing). If soap and water are not available, use hand sanitizer. ? Change your dressing as told by your health care provider.  Check your puncture site every day for signs of infection. Check for: ? More redness, swelling, or pain. ? Fluid or blood. ? Warmth. ? Pus or a bad smell.   Activity  Return to your normal activities as told by your health care provider. Ask your health care provider what activities are safe for you.  Do not lift anything that is heavier than 10 lb (4.5 kg), or the limit that you are told, until your health care provider says that it is safe.  Do not drive for 24 hours if you were given a sedative during your procedure. General instructions  Take over-the-counter and prescription medicines only as told by your health care provider.  Do not take baths, swim, or use a hot tub until your health care provider approves. Ask your health care provider if you may take showers. You may only be allowed to take sponge baths.  If directed, put ice on the affected area. To do this: ? Put ice in a plastic bag. ? Place a towel between your skin and the bag. ? Leave the  ice on for 20 minutes, 2-3 times a day.  Keep all follow-up visits as told by your health care provider. This is important.   Contact a health care provider if:  Your pain is not controlled with medicine.  You have a fever.  You have more redness, swelling, or pain around the puncture site.  You have fluid or blood coming from the puncture site.  Your puncture site feels warm to the touch.  You have pus or a bad smell coming from the puncture site. Summary  After the procedure, it is common to have mild pain, tenderness, swelling, and bruising.  Follow instructions from your health care provider about how to take care of the puncture site and what activities are safe for you.  Take over-the-counter and prescription medicines only as told by your health care provider.  Contact a health care provider if you have any signs of infection, such as fluid or blood coming from the puncture site. This information is not intended to replace advice given to you by your health care provider. Make sure you discuss any questions you have with your health care provider.   Moderate Conscious Sedation, Adult, Care After This sheet gives you information about how to care for yourself after your procedure. Your health care provider may also give you more specific instructions. If you have problems or questions, contact your health care provider. What can I expect after the procedure? After the procedure, it is common to   have:  Sleepiness for several hours.  Impaired judgment for several hours.  Difficulty with balance.  Vomiting if you eat too soon. Follow these instructions at home: For the time period you were told by your health care provider:  Rest.  Do not participate in activities where you could fall or become injured.  Do not drive or use machinery.  Do not drink alcohol.  Do not take sleeping pills or medicines that cause drowsiness.  Do not make important decisions or sign  legal documents.  Do not take care of children on your own.      Eating and drinking  Follow the diet recommended by your health care provider.  Drink enough fluid to keep your urine pale yellow.  If you vomit: ? Drink water, juice, or soup when you can drink without vomiting. ? Make sure you have little or no nausea before eating solid foods.   General instructions  Take over-the-counter and prescription medicines only as told by your health care provider.  Have a responsible adult stay with you for the time you are told. It is important to have someone help care for you until you are awake and alert.  Do not smoke.  Keep all follow-up visits as told by your health care provider. This is important. Contact a health care provider if:  You are still sleepy or having trouble with balance after 24 hours.  You feel light-headed.  You keep feeling nauseous or you keep vomiting.  You develop a rash.  You have a fever.  You have redness or swelling around the IV site. Get help right away if:  You have trouble breathing.  You have new-onset confusion at home. Summary  After the procedure, it is common to feel sleepy, have impaired judgment, or feel nauseous if you eat too soon.  Rest after you get home. Know the things you should not do after the procedure.  Follow the diet recommended by your health care provider and drink enough fluid to keep your urine pale yellow.  Get help right away if you have trouble breathing or new-onset confusion at home. This information is not intended to replace advice given to you by your health care provider. Make sure you discuss any questions you have with your health care provider. Document Revised: 11/30/2019 Document Reviewed: 06/28/2019 Elsevier Patient Education  2021 [employer]vier Inc.  

## 2020-12-29 LAB — SURGICAL PATHOLOGY

## 2021-01-02 DIAGNOSIS — Z23 Encounter for immunization: Secondary | ICD-10-CM | POA: Diagnosis not present

## 2021-01-06 ENCOUNTER — Encounter (HOSPITAL_COMMUNITY): Payer: Self-pay | Admitting: Hematology and Oncology

## 2021-01-14 ENCOUNTER — Telehealth: Payer: Self-pay | Admitting: *Deleted

## 2021-01-14 DIAGNOSIS — I129 Hypertensive chronic kidney disease with stage 1 through stage 4 chronic kidney disease, or unspecified chronic kidney disease: Secondary | ICD-10-CM | POA: Diagnosis not present

## 2021-01-14 DIAGNOSIS — N183 Chronic kidney disease, stage 3 unspecified: Secondary | ICD-10-CM | POA: Diagnosis not present

## 2021-01-14 DIAGNOSIS — U071 COVID-19: Secondary | ICD-10-CM | POA: Diagnosis not present

## 2021-01-14 NOTE — Telephone Encounter (Signed)
Received call from [place] @ Dr. Irma Newness office regarding the results from pt's bone marrow biopsy from 12/25/20. Pt told Dr. Sabra Heck that she did not know the results of that biopsy.  Pt requesting call back about her bone marrow biopsy

## 2021-01-15 LAB — SURGICAL PATHOLOGY

## 2021-01-16 ENCOUNTER — Telehealth: Payer: Self-pay | Admitting: Hematology and Oncology

## 2021-01-16 NOTE — Telephone Encounter (Signed)
Called Brittney [name] to discuss the results of her bone marrow biopsy.  The bone marrow biopsy did not show any clear cause for the patient's thrombocytopenia.  There is a minor B-cell population which is not likely of any clinical significance.  Differential diagnosis for this patient's thrombocytopenia includes ITP, medication side effect, or idiopathic.  At this time I recommend continuing to follow with consideration of starting steroid therapy if the patient's platelet count drops below 30.  The patient voiced understanding of these findings.  We plan to see her back in October for a follow-up visit.  Ledell Peoples, MD Department of Hematology/Oncology [place] at [facility] Phone: [phone] Pager: [phone] Email: [email or web address]

## 2021-02-22 DIAGNOSIS — R109 Unspecified abdominal pain: Secondary | ICD-10-CM | POA: Diagnosis not present

## 2021-02-22 DIAGNOSIS — R102 Pelvic and perineal pain: Secondary | ICD-10-CM | POA: Diagnosis not present

## 2021-02-22 DIAGNOSIS — R3911 Hesitancy of micturition: Secondary | ICD-10-CM | POA: Diagnosis not present

## 2021-02-22 DIAGNOSIS — N3000 Acute cystitis without hematuria: Secondary | ICD-10-CM | POA: Diagnosis not present

## 2021-03-17 DIAGNOSIS — R1030 Lower abdominal pain, unspecified: Secondary | ICD-10-CM | POA: Diagnosis not present

## 2021-04-06 ENCOUNTER — Other Ambulatory Visit: Payer: Self-pay

## 2021-04-06 ENCOUNTER — Emergency Department (HOSPITAL_BASED_OUTPATIENT_CLINIC_OR_DEPARTMENT_OTHER)
Admission: EM | Admit: 2021-04-06 | Discharge: 2021-04-06 | Disposition: A | Discharge status: Home / Self Care | Payer: Medicare Other | Attending: Emergency Medicine | Admitting: Emergency Medicine

## 2021-04-06 ENCOUNTER — Encounter (HOSPITAL_BASED_OUTPATIENT_CLINIC_OR_DEPARTMENT_OTHER): Payer: Self-pay | Admitting: Obstetrics and Gynecology

## 2021-04-06 ENCOUNTER — Emergency Department (HOSPITAL_BASED_OUTPATIENT_CLINIC_OR_DEPARTMENT_OTHER): Payer: Medicare Other

## 2021-04-06 DIAGNOSIS — Z85828 Personal history of other malignant neoplasm of skin: Secondary | ICD-10-CM | POA: Diagnosis not present

## 2021-04-06 DIAGNOSIS — K5900 Constipation, unspecified: Secondary | ICD-10-CM | POA: Diagnosis not present

## 2021-04-06 DIAGNOSIS — Z91040 Latex allergy status: Secondary | ICD-10-CM | POA: Diagnosis not present

## 2021-04-06 DIAGNOSIS — E039 Hypothyroidism, unspecified: Secondary | ICD-10-CM | POA: Insufficient documentation

## 2021-04-06 DIAGNOSIS — N183 Chronic kidney disease, stage 3 unspecified: Secondary | ICD-10-CM | POA: Diagnosis not present

## 2021-04-06 DIAGNOSIS — K5732 Diverticulitis of large intestine without perforation or abscess without bleeding: Secondary | ICD-10-CM | POA: Diagnosis not present

## 2021-04-06 DIAGNOSIS — R109 Unspecified abdominal pain: Secondary | ICD-10-CM | POA: Diagnosis not present

## 2021-04-06 DIAGNOSIS — R1032 Left lower quadrant pain: Secondary | ICD-10-CM | POA: Diagnosis not present

## 2021-04-06 DIAGNOSIS — K5792 Diverticulitis of intestine, part unspecified, without perforation or abscess without bleeding: Secondary | ICD-10-CM | POA: Diagnosis not present

## 2021-04-06 DIAGNOSIS — Z79899 Other long term (current) drug therapy: Secondary | ICD-10-CM | POA: Diagnosis not present

## 2021-04-06 LAB — CBC
HCT: 40.4 % (ref 36.0–46.0)
Hemoglobin: 12.7 g/dL (ref 12.0–15.0)
MCH: 25.3 pg — ABNORMAL LOW (ref 26.0–34.0)
MCHC: 31.4 g/dL (ref 30.0–36.0)
MCV: 80.5 fL (ref 80.0–100.0)
Platelets: 179 10*3/uL (ref 150–400)
RBC: 5.02 MIL/uL (ref 3.87–5.11)
RDW: 15.3 % (ref 11.5–15.5)
WBC: 12.5 10*3/uL — ABNORMAL HIGH (ref 4.0–10.5)
nRBC: 0 % (ref 0.0–0.2)

## 2021-04-06 LAB — URINALYSIS, ROUTINE W REFLEX MICROSCOPIC
Bilirubin Urine: NEGATIVE
Glucose, UA: NEGATIVE mg/dL
Hgb urine dipstick: NEGATIVE
Ketones, ur: NEGATIVE mg/dL
Nitrite: NEGATIVE
Protein, ur: NEGATIVE mg/dL
Specific Gravity, Urine: 1.016 (ref 1.005–1.030)
pH: 5.5 (ref 5.0–8.0)

## 2021-04-06 LAB — COMPREHENSIVE METABOLIC PANEL
ALT: 10 U/L (ref 0–44)
AST: 15 U/L (ref 15–41)
Albumin: 4.2 g/dL (ref 3.5–5.0)
Alkaline Phosphatase: 104 U/L (ref 38–126)
Anion gap: 8 (ref 5–15)
BUN: 18 mg/dL (ref 8–23)
CO2: 29 mmol/L (ref 22–32)
Calcium: 9.4 mg/dL (ref 8.9–10.3)
Chloride: 100 mmol/L (ref 98–111)
Creatinine, Ser: 1.19 mg/dL — ABNORMAL HIGH (ref 0.44–1.00)
GFR, Estimated: 45 mL/min — ABNORMAL LOW (ref >60)
Glucose, Bld: 118 mg/dL — ABNORMAL HIGH (ref 70–99)
Potassium: 4.1 mmol/L (ref 3.5–5.1)
Sodium: 137 mmol/L (ref 135–145)
Total Bilirubin: 0.4 mg/dL (ref 0.3–1.2)
Total Protein: 7.3 g/dL (ref 6.5–8.1)

## 2021-04-06 LAB — LIPASE, BLOOD: Lipase: 48 U/L (ref 11–51)

## 2021-04-06 MED ORDER — ONDANSETRON HCL 4 MG/2ML IJ SOLN
4.0000 mg | Freq: Once | INTRAMUSCULAR | Status: AC
  Administered 2021-04-06: 4 mg via INTRAVENOUS
  Filled 2021-04-06: qty 2

## 2021-04-06 MED ORDER — SODIUM CHLORIDE 0.9 % IV BOLUS
500.0000 mL | Freq: Once | INTRAVENOUS | Status: AC
  Administered 2021-04-06: 500 mL via INTRAVENOUS

## 2021-04-06 MED ORDER — MORPHINE SULFATE (PF) 4 MG/ML IV SOLN
4.0000 mg | Freq: Once | INTRAVENOUS | Status: AC
Start: 2021-04-06 — End: 2021-04-06
  Administered 2021-04-06: 4 mg via INTRAVENOUS
  Filled 2021-04-06: qty 1

## 2021-04-06 MED ORDER — METRONIDAZOLE 500 MG PO TABS: 500.0000 mg | ORAL_TABLET | Freq: Two times a day (BID) | ORAL | 0 refills | Status: DC

## 2021-04-06 MED ORDER — SODIUM CHLORIDE 0.9 % IV SOLN: INTRAVENOUS | Status: DC | PRN

## 2021-04-06 MED ORDER — METRONIDAZOLE 500 MG/100ML IV SOLN
500.0000 mg | Freq: Once | INTRAVENOUS | Status: AC
  Administered 2021-04-06: 500 mg via INTRAVENOUS
  Filled 2021-04-06: qty 100

## 2021-04-06 MED ORDER — CIPROFLOXACIN HCL 500 MG PO TABS: 500.0000 mg | ORAL_TABLET | Freq: Two times a day (BID) | ORAL | 0 refills | Status: DC

## 2021-04-06 MED ORDER — SODIUM CHLORIDE 0.9 % IV SOLN
INTRAVENOUS | Status: DC | PRN
  Administered 2021-04-06: 250 mL via INTRAVENOUS

## 2021-04-06 MED ORDER — CIPROFLOXACIN IN D5W 400 MG/200ML IV SOLN
400.0000 mg | Freq: Once | INTRAVENOUS | Status: AC
  Administered 2021-04-06: 400 mg via INTRAVENOUS
  Filled 2021-04-06: qty 200

## 2021-04-06 MED ORDER — IOHEXOL 350 MG/ML SOLN
60.0000 mL | Freq: Once | INTRAVENOUS | Status: AC | PRN
  Administered 2021-04-06: 60 mL via INTRAVENOUS

## 2021-04-06 NOTE — Discharge Instructions (Addendum)
You were seen in the emergency department for lower abdominal pain.  You had a CAT scan that showed you have acute diverticulitis.  We discussed admission but you felt you could manage this at home.  Please finish your antibiotics.  Clear liquid diet until pain improved and then advance slowly.  Due to medication cross-reaction please do not take your tizanidine while you are on the antibiotics.  Follow-up with your doctor.  Return to the emergency department if any high fevers or worsening pain.

## 2021-04-06 NOTE — ED Triage Notes (Signed)
Patient reports to the ER for lower abdominal pain. Patient reports she went to the urologist this morning and was sent here for CT scan. Patient reports some discomfort with urination, reports hx of diverticulitis but reports this feels different

## 2021-04-06 NOTE — ED Provider Notes (Signed)
Care assumed from Dr. Melina Copa at 90 PM.  86 year old female presenting for weakness.  Found to have diverticulitis.  Currently getting IV antibiotic infusions.  She was offered admission but declined.  Reassess following antibiotics.  If still preferring to go home, discharged on Cipro and Flagyl. Physical Exam  BP (!) 164/63   Pulse 71   Temp 98 F (36.7 C)   Resp 14   Ht 5' 1.5" (1.562 m)   Wt 73.5 kg   SpO2 98%   BMI 30.11 kg/m   Physical Exam Constitutional:      General: She is not in acute distress.    Appearance: She is well-developed. She is not ill-appearing, toxic-appearing or diaphoretic.  Neurological:     Mental Status: She is alert.    ED Course/Procedures   Clinical Course as of 04/07/21 1152  Mon Apr 06, 2021  1432 Patient stated her pain is improved but not completely resolved.  I reviewed the results of work-up with her.  I offered her admission to the hospital for IV antibiotics and continued management of her symptoms.  She said she would rather go home and trial oral antibiotics.  Will give first dose here and see if she continues to feel such and if so we will prescribe her some Cipro and Flagyl for outpatient treatment. [MB]    Clinical Course User Index [MB] Hayden Rasmussen, MD    Procedures  MDM  On assessment, patient resting comfortably.  IV antibiotic infusions are completed.  She continues to state that she would prefer to go home on oral antibiotics.  She was encouraged to return to the ED at any time for hospital admission if she so desires.  She denies any nausea or difficulty with p.o. tolerance at home.  Patient was discharged in stable condition.       Godfrey Pick, MD 04/07/21 1153

## 2021-04-06 NOTE — ED Provider Notes (Signed)
**Note Brittney-Identified via Obfuscation** [facility] Provider Note   CSN: [id number] Arrival date & time: 04/06/21  1049     History Chief Complaint  Patient presents with   Abdominal Pain    Brittney [name] is a 86 y.o. female.  She is here with a complaint of 3 days of lower abdominal pain.  She rates it as severe in nature and started gradually.  Not associate with any urinary symptoms.  She does feel little constipated but has been moving her bowels daily.  No fevers or chills but does feel hot and cold.  No chest pain shortness of breath nausea or vomiting.  She went to her urologist today who said her urine was clean and she needed to come to the emergency department for a CAT scan.  The history is provided by the patient.  Abdominal Pain Pain location:  LLQ and suprapubic Pain quality: aching   Pain radiates to:  RLQ Pain severity:  Severe Onset quality:  Gradual Duration:  3 days Timing:  Constant Progression:  Worsening Chronicity:  New Context: not trauma   Relieved by:  Nothing Worsened by:  Palpation and position changes Ineffective treatments:  None tried Associated symptoms: constipation   Associated symptoms: no chest pain, no cough, no diarrhea, no dysuria, no fever, no hematemesis, no hematochezia, no hematuria, no nausea, no shortness of breath, no sore throat and no vomiting       Past Medical History:  Diagnosis Date   CKD (chronic kidney disease), stage III (HCC)    Familial hypercholesterolemia    GERD (gastroesophageal reflux disease)    Glaucoma    Hypothyroid    Migraine headache    Post herpetic neuralgia     Patient Active Problem List   Diagnosis Date Noted   Intractable migraine without aura and with status migrainosus 08/03/2017   Labyrinthine vestibulitis, unspecified laterality 08/03/2017   Vertigo, aural, unspecified laterality 08/03/2017   Cerebral microvascular disease 08/03/2017   Palpitations 12/14/2016   Amnestic MCI (mild cognitive  impairment with memory loss) 07/22/2016   Sensorineural hearing loss (SNHL) of both ears 07/22/2016   Memory loss 07/15/2015   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[place]) 07/15/2015   Pancreatic cyst 05/26/2014   Hypothyroid    GERD (gastroesophageal reflux disease)    Migraine headache    CKD (chronic kidney disease), stage III ([place])    Diverticulitis 05/25/2014   Dyspnea 01/03/2014   Heart murmur 01/03/2014   Familial hypercholesterolemia 01/03/2014   Pseudophakia of left eye 05/29/2012   Personal history of other infectious and parasitic diseases 05/17/2012   History of shingles 05/17/2012   Hyperlipidemia, unspecified 05/17/2012   Latex allergy 05/17/2012   Thyroid nodule 05/17/2012   Cataract cortical, senile 04/04/2012   Cortical senile cataract 04/04/2012   Open-angle glaucoma 04/04/2012    Past Surgical History:  Procedure Laterality Date   ANAL FISSURE REPAIR     APPENDECTOMY     BASAL CELL CARCINOMA EXCISION     cataract surgery     left   CHOLECYSTECTOMY     hemithyroidectomy     INCONTINENCE SURGERY     THYROIDECTOMY     TUBAL LIGATION     VAGINAL HYSTERECTOMY     vaginal vault repair       OB History     Gravida      Para      Term      Preterm      AB      Living  4  SAB      IAB      Ectopic      Multiple      Live Births              Family History  Problem Relation Age of Onset   Heart disease Mother        chf   Heart disease Sister        CABG    Social History   Tobacco Use   Smoking status: Never   Smokeless tobacco: Never  Vaping Use   Vaping Use: Never used  Substance Use Topics   Alcohol use: Never   Drug use: Never    Home Medications Prior to Admission medications   Medication Sig Start Date End Date Taking? Authorizing Provider  acetaminophen (TYLENOL) 500 MG tablet 1 tablet as needed    [provider]  butalbital-acetaminophen-caffeine (FIORICET, ESGIC) 50-325-40 MG tablet take 1 tablet by  mouth if needed for headache, no more than 1 a day 01/09/16   [provider]  carboxymethylcellulose (REFRESH PLUS) 0.5 % SOLN Apply to eye.    [provider]  Cholecalciferol 25 MCG (1000 UT) capsule Take by mouth.    [provider]  donepezil (ARICEPT) 10 MG tablet Take 1 tablet (10 mg total) by mouth at bedtime. 11/03/17   Dohmeier, Asencion Partridge, MD  erythromycin ophthalmic ointment Place into the right eye 4 (four) times daily. 11/17/20   [provider]  escitalopram (LEXAPRO) 5 MG tablet Take by mouth. 11/03/20   [provider]  ezetimibe (ZETIA) 10 MG tablet Take 10 mg by mouth daily.    [provider]  gabapentin (NEURONTIN) 300 MG capsule Take 300 mg by mouth 3 (three) times daily. 05/05/20   [provider]  levocetirizine (XYZAL) 5 MG tablet TAKE 1 TABLET BY MOUTH EVERY EVENING 06/21/19   Olalere, Adewale A, MD  levothyroxine (SYNTHROID, LEVOTHROID) 75 MCG tablet Take 75 mcg by mouth daily before breakfast.    [provider]  LUMIGAN 0.01 % SOLN INSTILL 1 GTT INTO OU NIGHTLY 10/25/17   [provider]  MYRBETRIQ 50 MG TB24 tablet Take 50 mg by mouth daily. 05/03/20   [provider]  timolol (TIMOPTIC) 0.25 % ophthalmic solution instill 1 drop into both eyes once daily 12/29/15   [provider]  tizanidine (ZANAFLEX) 2 MG capsule Take 2 mg by mouth 3 (three) times daily as needed for muscle spasms.    [provider]    Allergies    Amitriptyline, Latex, Meperidine, Estradiol, Sulfa antibiotics, Vioxx [rofecoxib], Augmentin [amoxicillin-pot clavulanate], Lipitor [atorvastatin], Motrin [ibuprofen], and Rosuvastatin  Review of Systems   Review of Systems  Constitutional:  Negative for fever.  HENT:  Negative for sore throat.   Eyes:  Negative for visual disturbance.  Respiratory:  Negative for cough and shortness of breath.   Cardiovascular:  Negative for chest pain.   Gastrointestinal:  Positive for abdominal pain and constipation. Negative for diarrhea, hematemesis, hematochezia, nausea and vomiting.  Genitourinary:  Negative for dysuria and hematuria.  Musculoskeletal:  Negative for back pain.  Skin:  Negative for rash.  Neurological:  Negative for headaches.   Physical Exam Updated Vital Signs BP (!) 175/66 (BP Location: Right Arm)   Pulse 75   Temp 97.6 F (36.4 C) (Tympanic)   Resp 16   Ht 5' 1.5" (1.562 m)   Wt 73.5 kg   SpO2 97%   BMI 30.11 kg/m  Physical Exam Vitals and nursing note reviewed.  Constitutional:      General: She is not in acute distress.    Appearance: Normal appearance. She is well-developed.  HENT:     Head: Normocephalic and atraumatic.  Eyes:     Conjunctiva/sclera: Conjunctivae normal.  Cardiovascular:     Rate and Rhythm: Normal rate and regular rhythm.     Heart sounds: No murmur heard. Pulmonary:     Effort: Pulmonary effort is normal. No respiratory distress.     Breath sounds: Normal breath sounds.  Abdominal:     Palpations: Abdomen is soft.     Tenderness: There is abdominal tenderness in the suprapubic area and left lower quadrant. There is no guarding or rebound.  Musculoskeletal:        General: Normal range of motion.     Cervical back: Neck supple.     Right lower leg: No edema.     Left lower leg: No edema.  Skin:    General: Skin is warm and dry.  Neurological:     General: No focal deficit present.     Mental Status: She is alert.    ED Results / Procedures / Treatments   Labs (all labs ordered are listed, but only abnormal results are displayed) Labs Reviewed  COMPREHENSIVE METABOLIC PANEL - Abnormal; Notable for the following components:      Result Value   Glucose, Bld 118 (*)    Creatinine, Ser 1.19 (*)    GFR, Estimated 45 (*)    All other components within normal limits  CBC - Abnormal; Notable for the following components:   WBC 12.5 (*)    MCH 25.3 (*)    All other  components within normal limits  URINALYSIS, ROUTINE W REFLEX MICROSCOPIC - Abnormal; Notable for the following components:   Leukocytes,Ua MODERATE (*)    All other components within normal limits  LIPASE, BLOOD    EKG None  Radiology CT Abdomen Pelvis W Contrast  Result Date: 04/06/2021 CLINICAL DATA:  Lower abdominal pain, discomfort with urination. EXAM: CT ABDOMEN AND PELVIS WITH CONTRAST TECHNIQUE: Multidetector CT imaging of the abdomen and pelvis was performed using the standard protocol following bolus administration of intravenous contrast. CONTRAST:  61m OMNIPAQUE IOHEXOL 350 MG/ML SOLN COMPARISON:  07/04/2019. FINDINGS: Lower chest: Lung bases are clear. Heart size normal. No pericardial or pleural effusion. Distal esophagus is unremarkable. Hepatobiliary: Subcentimeter low-attenuation lesion in the left hepatic lobe is too small to characterize but unchanged and likely a cyst. Cholecystectomy. No unexpected biliary ductal dilatation. Pancreas: 1 cm low-attenuation lesion in the uncinate process, described on 07/04/2019, is not as well appreciated today. Spleen: Negative. Adrenals/Urinary Tract: Adrenal glands and kidneys are unremarkable. Ureters are decompressed. Bladder is grossly unremarkable. Stomach/Bowel: Stomach is unremarkable. Duodenal diverticula. Small bowel, appendix and majority of the colon are otherwise unremarkable. Wall thickening and pericolonic inflammatory haziness/stranding involving the proximal sigmoid colon. No extraluminal air or organized fluid collection. Vascular/Lymphatic: Atherosclerotic calcification of the aorta. There may be nonocclusive thrombus in lower left gonadal vein. No pathologically enlarged lymph nodes. Reproductive: Hysterectomy.  No adnexal mass. Other: No free fluid. Mesenteries and peritoneum are otherwise unremarkable. Musculoskeletal: No worrisome lytic or sclerotic lesions. IMPRESSION: 1. Sigmoid diverticulitis without rupture or abscess.  Probable nonocclusive thrombus in the lower left gonadal vein, likely related. 2.  Aortic atherosclerosis (ICD10-I70.0). Electronically Signed   By: MLorin PicketM.D.   On: 04/06/2021 14:14    Procedures Procedures   Medications  Ordered in ED Medications  morphine 4 MG/ML injection 4 mg (has no administration in time range)  ondansetron (ZOFRAN) injection 4 mg (has no administration in time range)  sodium chloride 0.9 % bolus 500 mL (has no administration in time range)    ED Course  I have reviewed the triage vital signs and the nursing notes.  Pertinent labs & imaging results that were available during my care of the patient were reviewed by me and considered in my medical decision making (see chart for details).  Clinical Course as of 04/06/21 1733  Mon Apr 06, 2021  1432 Patient stated her pain is improved but not completely resolved.  I reviewed the results of work-up with her.  I offered her admission to the hospital for IV antibiotics and continued management of her symptoms.  She said she would rather go home and trial oral antibiotics.  Will give first dose here and see if she continues to feel such and if so we will prescribe her some Cipro and Flagyl for outpatient treatment. [MB]    Clinical Course User Index [MB] Hayden Rasmussen, MD   MDM Rules/Calculators/A&P                          This patient complains of low abdominal pain; this involves an extensive number of treatment Options and is a complaint that carries with it a high risk of complications and Morbidity. The differential includes diverticulitis, colitis, UTI, pyelonephritis, renal stone  I ordered, reviewed and interpreted labs, which included CBC with elevated white count hemoglobin stable from priors, chemistries fairly normal other than chronic elevated creatinine, urinalysis without signs of infection I ordered medication IV pain medicine and fluids, nausea medication, IV antibiotics I ordered  imaging studies which included CT abdomen and pelvis and I independently    visualized and interpreted imaging which showed acute uncomplicated diverticulitis Additional history obtained from patient's husband Previous records obtained and reviewed in epic, no recent admissions  After the interventions stated above, I reevaluated the patient and found patient's pain to be improved although not resolved.  She was given the option for inpatient treatment but elects to be discharged on IV antibiotics.  Her care is signed out to oncoming provider Dr. Doren Custard to reassess patient after IV antibiotics and if she still wishes to be discharged I have prepared prescriptions for her and discharge instructions.   Final Clinical Impression(s) / ED Diagnoses Final diagnoses:  Acute diverticulitis    Rx / DC Orders ED Discharge Orders          Ordered    ciprofloxacin (CIPRO) 500 MG tablet  Every 12 hours        04/06/21 1435    metroNIDAZOLE (FLAGYL) 500 MG tablet  2 times daily        04/06/21 1435             Hayden Rasmussen, MD 04/06/21 1736

## 2021-04-10 DIAGNOSIS — K5792 Diverticulitis of intestine, part unspecified, without perforation or abscess without bleeding: Secondary | ICD-10-CM | POA: Diagnosis not present

## 2021-04-21 DIAGNOSIS — H401131 Primary open-angle glaucoma, bilateral, mild stage: Secondary | ICD-10-CM | POA: Diagnosis not present

## 2021-05-04 DIAGNOSIS — M79605 Pain in left leg: Secondary | ICD-10-CM | POA: Diagnosis not present

## 2021-05-04 DIAGNOSIS — G3184 Mild cognitive impairment, so stated: Secondary | ICD-10-CM | POA: Diagnosis not present

## 2021-05-04 DIAGNOSIS — M8589 Other specified disorders of bone density and structure, multiple sites: Secondary | ICD-10-CM | POA: Diagnosis not present

## 2021-05-04 DIAGNOSIS — F5101 Primary insomnia: Secondary | ICD-10-CM | POA: Diagnosis not present

## 2021-05-04 DIAGNOSIS — Z23 Encounter for immunization: Secondary | ICD-10-CM | POA: Diagnosis not present

## 2021-05-04 DIAGNOSIS — B0229 Other postherpetic nervous system involvement: Secondary | ICD-10-CM | POA: Diagnosis not present

## 2021-05-04 DIAGNOSIS — M79604 Pain in right leg: Secondary | ICD-10-CM | POA: Diagnosis not present

## 2021-05-04 DIAGNOSIS — E559 Vitamin D deficiency, unspecified: Secondary | ICD-10-CM | POA: Diagnosis not present

## 2021-05-04 DIAGNOSIS — I129 Hypertensive chronic kidney disease with stage 1 through stage 4 chronic kidney disease, or unspecified chronic kidney disease: Secondary | ICD-10-CM | POA: Diagnosis not present

## 2021-05-04 DIAGNOSIS — E7800 Pure hypercholesterolemia, unspecified: Secondary | ICD-10-CM | POA: Diagnosis not present

## 2021-05-04 DIAGNOSIS — R7303 Prediabetes: Secondary | ICD-10-CM | POA: Diagnosis not present

## 2021-05-04 DIAGNOSIS — E039 Hypothyroidism, unspecified: Secondary | ICD-10-CM | POA: Diagnosis not present

## 2021-05-05 DIAGNOSIS — Z1389 Encounter for screening for other disorder: Secondary | ICD-10-CM | POA: Diagnosis not present

## 2021-05-05 DIAGNOSIS — Z0000 Encounter for general adult medical examination without abnormal findings: Secondary | ICD-10-CM | POA: Diagnosis not present

## 2021-05-21 DIAGNOSIS — Z23 Encounter for immunization: Secondary | ICD-10-CM | POA: Diagnosis not present

## 2021-05-27 ENCOUNTER — Other Ambulatory Visit: Payer: Self-pay | Admitting: *Deleted

## 2021-05-27 DIAGNOSIS — D696 Thrombocytopenia, unspecified: Secondary | ICD-10-CM

## 2021-05-28 ENCOUNTER — Inpatient Hospital Stay: Payer: Medicare Other

## 2021-05-28 ENCOUNTER — Other Ambulatory Visit: Payer: Self-pay

## 2021-05-28 ENCOUNTER — Inpatient Hospital Stay: Payer: Medicare Other | Attending: Hematology and Oncology | Admitting: Hematology and Oncology

## 2021-05-28 VITALS — BP 154/53 | HR 60 | Temp 98.7°F | Resp 17 | Wt 156.8 lb

## 2021-05-28 DIAGNOSIS — D696 Thrombocytopenia, unspecified: Secondary | ICD-10-CM | POA: Insufficient documentation

## 2021-05-28 DIAGNOSIS — Z79899 Other long term (current) drug therapy: Secondary | ICD-10-CM | POA: Diagnosis not present

## 2021-05-28 LAB — CBC WITH DIFFERENTIAL (CANCER CENTER ONLY)
Abs Immature Granulocytes: 0.01 10*3/uL (ref 0.00–0.07)
Basophils Absolute: 0.1 10*3/uL (ref 0.0–0.1)
Basophils Relative: 1 %
Eosinophils Absolute: 0.2 10*3/uL (ref 0.0–0.5)
Eosinophils Relative: 4 %
HCT: 38.8 % (ref 36.0–46.0)
Hemoglobin: 12.3 g/dL (ref 12.0–15.0)
Immature Granulocytes: 0 %
Lymphocytes Relative: 35 %
Lymphs Abs: 1.9 10*3/uL (ref 0.7–4.0)
MCH: 25.7 pg — ABNORMAL LOW (ref 26.0–34.0)
MCHC: 31.7 g/dL (ref 30.0–36.0)
MCV: 81 fL (ref 80.0–100.0)
Monocytes Absolute: 0.4 10*3/uL (ref 0.1–1.0)
Monocytes Relative: 7 %
Neutro Abs: 2.8 10*3/uL (ref 1.7–7.7)
Neutrophils Relative %: 53 %
Platelet Count: 138 10*3/uL — ABNORMAL LOW (ref 150–400)
RBC: 4.79 MIL/uL (ref 3.87–5.11)
RDW: 14.6 % (ref 11.5–15.5)
WBC Count: 5.4 10*3/uL (ref 4.0–10.5)
nRBC: 0 % (ref 0.0–0.2)

## 2021-05-28 LAB — CMP (CANCER CENTER ONLY)
ALT: 14 U/L (ref 0–44)
AST: 20 U/L (ref 15–41)
Albumin: 4.3 g/dL (ref 3.5–5.0)
Alkaline Phosphatase: 90 U/L (ref 38–126)
Anion gap: 7 (ref 5–15)
BUN: 20 mg/dL (ref 8–23)
CO2: 29 mmol/L (ref 22–32)
Calcium: 9.3 mg/dL (ref 8.9–10.3)
Chloride: 103 mmol/L (ref 98–111)
Creatinine: 1.31 mg/dL — ABNORMAL HIGH (ref 0.44–1.00)
GFR, Estimated: 40 mL/min — ABNORMAL LOW (ref >60)
Glucose, Bld: 109 mg/dL — ABNORMAL HIGH (ref 70–99)
Potassium: 4.7 mmol/L (ref 3.5–5.1)
Sodium: 139 mmol/L (ref 135–145)
Total Bilirubin: 0.5 mg/dL (ref 0.3–1.2)
Total Protein: 6.7 g/dL (ref 6.5–8.1)

## 2021-05-28 LAB — LACTATE DEHYDROGENASE: LDH: 144 U/L (ref 98–192)

## 2021-05-28 NOTE — Progress Notes (Signed)
[place] [phone]   Fax:([phone]  PROGRESS NOTE  Patient Care Team: Kathyrn Lass, MD as PCP - General (Family Medicine)  Hematological/Oncological History # Thrombocytopenia, Unclear Etiology 05/25/2014: WBC 8.2, Hgb 14.4, MCV 81.3, Plt 162 06/17/2015: WBC 5.5, Hgb 13.9, MCV 81, Plt 124 10/25/2018: WBC 6.1, Hgb 13.4, MCV 81.8, Plt 147 05/01/2020: WBC 6.5, Hgb 13.8, MCV 78.2, Plt 79 05/22/2020: establish care with Dr. Lorenso Courier  12/25/2020: Bone marrow biopsy performed which showed a minor monoclonal B-cell population but no other abnormalities noted. 05/28/2021: WBC 5.4, Hgb 12.3, MCV 81, Plt 138  Interval History:  Brittney [name] 86 y.o. female with medical history significant for thrombocytopenia of unclear etiology presents for a follow up visit. The patient's last visit was on 11/26/2020. In the interim since the last visit she underwent a bone marrow biopsy which showed no clear etiology for her thrombocytopenia.  On exam today Brittney [name] reports she has been well in the interim since her last visit back in April 2022.  She has had no major changes in health since her last visit other than emergency department visit in August for diverticulitis.  She notes that since that time she had no issues with bleeding, bruising, or dark stools.  Her energy levels have been good and she is not been having any abdominal swelling or appetite changes.  She notes that her appetite has been good and she has not had any issues with fatigue, weight loss, or shortness of breath.  She currently denies any fevers, chills, sweats, nausea, running or diarrhea.  A full 10 point ROS is listed below.  MEDICAL HISTORY:  Past Medical History:  Diagnosis Date   CKD (chronic kidney disease), stage III (HCC)    Familial hypercholesterolemia    GERD (gastroesophageal reflux disease)    Glaucoma    Hypothyroid    Migraine headache    Post herpetic neuralgia     SURGICAL  HISTORY: Past Surgical History:  Procedure Laterality Date   ANAL FISSURE REPAIR     APPENDECTOMY     BASAL CELL CARCINOMA EXCISION     cataract surgery     left   CHOLECYSTECTOMY     hemithyroidectomy     INCONTINENCE SURGERY     THYROIDECTOMY     TUBAL LIGATION     VAGINAL HYSTERECTOMY     vaginal vault repair      SOCIAL HISTORY: Social History   Socioeconomic History   Marital status: Married    Spouse name: Nadara Mustard   Number of children: 4   Years of education: 12   Highest education level: Not on file  Occupational History   Not on file  Tobacco Use   Smoking status: Never   Smokeless tobacco: Never  Vaping Use   Vaping Use: Never used  Substance and Sexual Activity   Alcohol use: Never   Drug use: Never   Sexual activity: Yes  Other Topics Concern   Not on file  Social History Narrative   Not on file   Social Determinants of Health   Financial Resource Strain: Not on file  Food Insecurity: Not on file  Transportation Needs: Not on file  Physical Activity: Not on file  Stress: Not on file  Social Connections: Not on file  Intimate Partner Violence: Not on file    FAMILY HISTORY: Family History  Problem Relation Age of Onset   Heart disease Mother        chf  Heart disease Sister        CABG    ALLERGIES:  is allergic to amitriptyline, latex, meperidine, estradiol, sulfa antibiotics, vioxx [rofecoxib], augmentin [amoxicillin-pot clavulanate], lipitor [atorvastatin], motrin [ibuprofen], and rosuvastatin.  MEDICATIONS:  Current Outpatient Medications  Medication Sig Dispense Refill   acetaminophen (TYLENOL) 500 MG tablet 1 tablet as needed     butalbital-acetaminophen-caffeine (FIORICET, ESGIC) 50-325-40 MG tablet take 1 tablet by mouth if needed for headache, no more than 1 a day  0   carboxymethylcellulose (REFRESH PLUS) 0.5 % SOLN Apply to eye.     Cholecalciferol 25 MCG (1000 UT) capsule Take by mouth.     ciprofloxacin (CIPRO) 500 MG  tablet Take 1 tablet (500 mg total) by mouth every 12 (twelve) hours. 14 tablet 0   donepezil (ARICEPT) 10 MG tablet Take 1 tablet (10 mg total) by mouth at bedtime. 90 tablet 3   erythromycin ophthalmic ointment Place into the right eye 4 (four) times daily.     escitalopram (LEXAPRO) 5 MG tablet Take by mouth.     ezetimibe (ZETIA) 10 MG tablet Take 10 mg by mouth daily.     gabapentin (NEURONTIN) 300 MG capsule Take 300 mg by mouth 3 (three) times daily.     levocetirizine (XYZAL) 5 MG tablet TAKE 1 TABLET BY MOUTH EVERY EVENING 30 tablet 5   levothyroxine (SYNTHROID, LEVOTHROID) 75 MCG tablet Take 75 mcg by mouth daily before breakfast.     LUMIGAN 0.01 % SOLN INSTILL 1 GTT INTO OU NIGHTLY  6   metroNIDAZOLE (FLAGYL) 500 MG tablet Take 1 tablet (500 mg total) by mouth 2 (two) times daily. 14 tablet 0   MYRBETRIQ 50 MG TB24 tablet Take 50 mg by mouth daily.     timolol (TIMOPTIC) 0.25 % ophthalmic solution instill 1 drop into both eyes once daily  0   tizanidine (ZANAFLEX) 2 MG capsule Take 2 mg by mouth 3 (three) times daily as needed for muscle spasms.     No current facility-administered medications for this visit.    REVIEW OF SYSTEMS:   Constitutional: ( - ) fevers, ( - )  chills , ( - ) night sweats Eyes: ( - ) blurriness of vision, ( - ) double vision, ( - ) watery eyes Ears, nose, mouth, throat, and face: ( - ) mucositis, ( - ) sore throat Respiratory: ( - ) cough, ( - ) dyspnea, ( - ) wheezes Cardiovascular: ( - ) palpitation, ( - ) chest discomfort, ( - ) lower extremity swelling Gastrointestinal:  ( - ) nausea, ( - ) heartburn, ( - ) change in bowel habits Skin: ( - ) abnormal skin rashes Lymphatics: ( - ) new lymphadenopathy, ( - ) easy bruising Neurological: ( - ) numbness, ( - ) tingling, ( - ) new weaknesses Behavioral/Psych: ( - ) mood change, ( - ) new changes  All other systems were reviewed with the patient and are negative.  PHYSICAL EXAMINATION:  Vitals:    05/28/21 1126  BP: (!) 154/53  Pulse: 60  Resp: 17  Temp: 98.7 F (37.1 C)  SpO2: 97%   Filed Weights   05/28/21 1126  Weight: 156 lb 12.8 oz (71.1 kg)    GENERAL: alert, no distress and comfortable SKIN: skin color, texture, turgor are normal, no rashes or significant lesions EYES: conjunctiva are pink and non-injected, sclera clear LUNGS: clear to auscultation and percussion with normal breathing effort HEART: regular rate & rhythm and no murmurs  and no lower extremity edema Musculoskeletal: no cyanosis of digits and no clubbing  PSYCH: alert & oriented x 3, fluent speech NEURO: no focal motor/sensory deficits  LABORATORY DATA:  I have reviewed the data as listed CBC Latest Ref Rng & Units 05/28/2021 04/06/2021 12/25/2020  WBC 4.0 - 10.5 K/uL 5.4 12.5(H) 5.2  Hemoglobin 12.0 - 15.0 g/dL 12.3 12.7 12.4  Hematocrit 36.0 - 46.0 % 38.8 40.4 40.4  Platelets 150 - 400 K/uL 138(L) 179 114(L)    CMP Latest Ref Rng & Units 05/28/2021 04/06/2021 11/26/2020  Glucose 70 - 99 mg/dL 109(H) 118(H) 109(H)  BUN 8 - 23 mg/dL _0 Creatinine 0.44 - 1.00 mg/dL 1.31(H) 1.19(H) 1.33(H)  Sodium 135 - 145 mmol/L 139 137 142  Potassium 3.5 - 5.1 mmol/L 4.7 4.1 4.3  Chloride 98 - 111 mmol/L 103 100 104  CO2 22 - 32 mmol/L _1 Calcium 8.9 - 10.3 mg/dL 9.3 9.4 9.1  Total Protein 6.5 - 8.1 g/dL 6.7 7.3 6.5  Total Bilirubin 0.3 - 1.2 mg/dL 0.5 0.4 0.5  Alkaline Phos 38 - 126 U/L 90 104 101  AST 15 - 41 U/L _2 ALT 0 - 44 U/L _3 No results found for: MPROTEIN Lab Results  Component Value Date   KPAFRELGTCHN 19.4 05/22/2020   LAMBDASER 8.7 05/22/2020   KAPLAMBRATIO 4.76 12/01/2020   KAPLAMBRATIO 2.23 (H) 05/22/2020    RADIOGRAPHIC STUDIES: No results found.  ASSESSMENT & PLAN Brittney [name] 86 y.o. female with medical history significant for thrombocytopenia of unclear etiology presents for a follow up visit.   After review the labs, review the records,  discussion with the patient the findings are most consistent with cytopenia of unclear etiology.  She has had negative hepatitis serologies and a normal nutritional evaluation.  Additionally prior imaging it showed no evidence of liver disease or splenomegaly (07/04/2019).  As such the next step was to perform a bone marrow biopsy, which showed no evidence of bone marrow disorder.  Given these findings the findings are most consistent with a mild ITP.  We will plan to see her back in 12 months time.  In the interim the patient were to have a dramatic drop in her platelet count less than 30 we could try a steroid pulse for a presumed diagnosis of ITP.  # Thrombocytopenia, Unclear Etiology --negative hepatitis serologies and normal nutritional evaluation with vitamin B12 and folate --Platelets 138 today, Hgb 12.3 and WBC 5.4 --Platelet count by citrate as well as a review of peripheral blood film show no evidence of clumping as cause of thrombocytopenia --SPEP showed no M protein, however serum free light chains have an abnormal ratio. Bone marrow biopsy showed no abnormalities to explain the odd light chain ratio.  --Because no clear etiology could be discerned we proceeded with a bone marrow biopsy.  We not find any evidence of MDS, bone marrow disorder, multiple myeloma. --possible diagnosis of ITP, which would be a diagnosis of exclusion. If Plts get <30 consider steroid pulse.  --RTC in 12 months  No orders of the defined types were placed in this encounter.   All questions were answered. The patient knows to call the clinic with any problems, questions or concerns.  A total of more than 30 minutes were spent on this encounter and over half of that time was spent on counseling and coordination of care as outlined above.   Lois Slagel T.  Lorenso Courier, MD Department of Hematology/Oncology [place] at [facility] Phone: [phone] Pager: [phone] Email:  [email or web address]  05/28/2021 4:18 PM

## 2021-06-12 ENCOUNTER — Other Ambulatory Visit: Payer: Self-pay | Admitting: Family Medicine

## 2021-06-12 DIAGNOSIS — Z1231 Encounter for screening mammogram for malignant neoplasm of breast: Secondary | ICD-10-CM

## 2021-06-16 DIAGNOSIS — Z85828 Personal history of other malignant neoplasm of skin: Secondary | ICD-10-CM | POA: Diagnosis not present

## 2021-06-16 DIAGNOSIS — L820 Inflamed seborrheic keratosis: Secondary | ICD-10-CM | POA: Diagnosis not present

## 2021-06-16 DIAGNOSIS — R21 Rash and other nonspecific skin eruption: Secondary | ICD-10-CM | POA: Diagnosis not present

## 2021-06-16 DIAGNOSIS — L218 Other seborrheic dermatitis: Secondary | ICD-10-CM | POA: Diagnosis not present

## 2021-06-16 DIAGNOSIS — C4441 Basal cell carcinoma of skin of scalp and neck: Secondary | ICD-10-CM | POA: Diagnosis not present

## 2021-06-16 DIAGNOSIS — L570 Actinic keratosis: Secondary | ICD-10-CM | POA: Diagnosis not present

## 2021-06-16 DIAGNOSIS — D485 Neoplasm of uncertain behavior of skin: Secondary | ICD-10-CM | POA: Diagnosis not present

## 2021-06-16 DIAGNOSIS — L821 Other seborrheic keratosis: Secondary | ICD-10-CM | POA: Diagnosis not present

## 2021-06-30 DIAGNOSIS — E559 Vitamin D deficiency, unspecified: Secondary | ICD-10-CM | POA: Diagnosis not present

## 2021-06-30 DIAGNOSIS — E039 Hypothyroidism, unspecified: Secondary | ICD-10-CM | POA: Diagnosis not present

## 2021-07-15 ENCOUNTER — Other Ambulatory Visit: Payer: Self-pay

## 2021-07-15 ENCOUNTER — Ambulatory Visit
Admission: RE | Admit: 2021-07-15 | Discharge: 2021-07-15 | Disposition: A | Discharge status: Home / Self Care | Payer: Medicare Other | Source: Ambulatory Visit | Attending: Family Medicine | Admitting: Family Medicine

## 2021-07-15 DIAGNOSIS — Z1231 Encounter for screening mammogram for malignant neoplasm of breast: Secondary | ICD-10-CM | POA: Diagnosis not present

## 2021-07-16 DIAGNOSIS — L718 Other rosacea: Secondary | ICD-10-CM | POA: Diagnosis not present

## 2021-07-16 DIAGNOSIS — L821 Other seborrheic keratosis: Secondary | ICD-10-CM | POA: Diagnosis not present

## 2021-07-16 DIAGNOSIS — C4441 Basal cell carcinoma of skin of scalp and neck: Secondary | ICD-10-CM | POA: Diagnosis not present

## 2021-09-07 DIAGNOSIS — I129 Hypertensive chronic kidney disease with stage 1 through stage 4 chronic kidney disease, or unspecified chronic kidney disease: Secondary | ICD-10-CM | POA: Diagnosis not present

## 2021-09-07 DIAGNOSIS — E7800 Pure hypercholesterolemia, unspecified: Secondary | ICD-10-CM | POA: Diagnosis not present

## 2021-09-07 DIAGNOSIS — E039 Hypothyroidism, unspecified: Secondary | ICD-10-CM | POA: Diagnosis not present

## 2021-09-07 DIAGNOSIS — N183 Chronic kidney disease, stage 3 unspecified: Secondary | ICD-10-CM | POA: Diagnosis not present

## 2021-11-03 DIAGNOSIS — H00015 Hordeolum externum left lower eyelid: Secondary | ICD-10-CM | POA: Diagnosis not present

## 2021-11-03 DIAGNOSIS — H401131 Primary open-angle glaucoma, bilateral, mild stage: Secondary | ICD-10-CM | POA: Diagnosis not present

## 2021-11-03 DIAGNOSIS — H25811 Combined forms of age-related cataract, right eye: Secondary | ICD-10-CM | POA: Diagnosis not present

## 2021-11-04 DIAGNOSIS — M791 Myalgia, unspecified site: Secondary | ICD-10-CM | POA: Diagnosis not present

## 2021-11-04 DIAGNOSIS — E559 Vitamin D deficiency, unspecified: Secondary | ICD-10-CM | POA: Diagnosis not present

## 2021-11-04 DIAGNOSIS — F5101 Primary insomnia: Secondary | ICD-10-CM | POA: Diagnosis not present

## 2021-11-04 DIAGNOSIS — I129 Hypertensive chronic kidney disease with stage 1 through stage 4 chronic kidney disease, or unspecified chronic kidney disease: Secondary | ICD-10-CM | POA: Diagnosis not present

## 2021-11-04 DIAGNOSIS — G720 Drug-induced myopathy: Secondary | ICD-10-CM | POA: Diagnosis not present

## 2021-11-04 DIAGNOSIS — M159 Polyosteoarthritis, unspecified: Secondary | ICD-10-CM | POA: Diagnosis not present

## 2021-11-04 DIAGNOSIS — N183 Chronic kidney disease, stage 3 unspecified: Secondary | ICD-10-CM | POA: Diagnosis not present

## 2021-11-04 DIAGNOSIS — I700 Atherosclerosis of aorta: Secondary | ICD-10-CM | POA: Diagnosis not present

## 2021-12-01 DIAGNOSIS — L853 Xerosis cutis: Secondary | ICD-10-CM | POA: Diagnosis not present

## 2021-12-01 DIAGNOSIS — Z85828 Personal history of other malignant neoplasm of skin: Secondary | ICD-10-CM | POA: Diagnosis not present

## 2021-12-01 DIAGNOSIS — L821 Other seborrheic keratosis: Secondary | ICD-10-CM | POA: Diagnosis not present

## 2021-12-01 DIAGNOSIS — L218 Other seborrheic dermatitis: Secondary | ICD-10-CM | POA: Diagnosis not present

## 2022-05-04 DIAGNOSIS — H401131 Primary open-angle glaucoma, bilateral, mild stage: Secondary | ICD-10-CM | POA: Diagnosis not present

## 2022-05-06 DIAGNOSIS — Z1389 Encounter for screening for other disorder: Secondary | ICD-10-CM | POA: Diagnosis not present

## 2022-05-06 DIAGNOSIS — Z23 Encounter for immunization: Secondary | ICD-10-CM | POA: Diagnosis not present

## 2022-05-06 DIAGNOSIS — Z0000 Encounter for general adult medical examination without abnormal findings: Secondary | ICD-10-CM | POA: Diagnosis not present

## 2022-05-27 ENCOUNTER — Other Ambulatory Visit: Payer: Self-pay | Admitting: Hematology and Oncology

## 2022-05-27 ENCOUNTER — Other Ambulatory Visit: Payer: Self-pay

## 2022-05-27 ENCOUNTER — Inpatient Hospital Stay: Payer: PPO | Attending: Hematology and Oncology | Admitting: Hematology and Oncology

## 2022-05-27 ENCOUNTER — Inpatient Hospital Stay: Payer: PPO

## 2022-05-27 VITALS — BP 173/70 | HR 58 | Temp 98.5°F | Resp 16 | Wt 159.5 lb

## 2022-05-27 DIAGNOSIS — D696 Thrombocytopenia, unspecified: Secondary | ICD-10-CM | POA: Diagnosis not present

## 2022-05-27 LAB — CBC WITH DIFFERENTIAL (CANCER CENTER ONLY)
Abs Immature Granulocytes: 0.01 10*3/uL (ref 0.00–0.07)
Basophils Absolute: 0.1 10*3/uL (ref 0.0–0.1)
Basophils Relative: 2 %
Eosinophils Absolute: 0.1 10*3/uL (ref 0.0–0.5)
Eosinophils Relative: 3 %
HCT: 37.3 % (ref 36.0–46.0)
Hemoglobin: 12 g/dL (ref 12.0–15.0)
Immature Granulocytes: 0 %
Lymphocytes Relative: 40 %
Lymphs Abs: 1.8 10*3/uL (ref 0.7–4.0)
MCH: 25.5 pg — ABNORMAL LOW (ref 26.0–34.0)
MCHC: 32.2 g/dL (ref 30.0–36.0)
MCV: 79.4 fL — ABNORMAL LOW (ref 80.0–100.0)
Monocytes Absolute: 0.4 10*3/uL (ref 0.1–1.0)
Monocytes Relative: 9 %
Neutro Abs: 2 10*3/uL (ref 1.7–7.7)
Neutrophils Relative %: 46 %
Platelet Count: 128 10*3/uL — ABNORMAL LOW (ref 150–400)
RBC: 4.7 MIL/uL (ref 3.87–5.11)
RDW: 14.4 % (ref 11.5–15.5)
WBC Count: 4.4 10*3/uL (ref 4.0–10.5)
nRBC: 0 % (ref 0.0–0.2)

## 2022-05-27 LAB — CMP (CANCER CENTER ONLY)
ALT: 9 U/L (ref 0–44)
AST: 15 U/L (ref 15–41)
Albumin: 4.3 g/dL (ref 3.5–5.0)
Alkaline Phosphatase: 83 U/L (ref 38–126)
Anion gap: 4 — ABNORMAL LOW (ref 5–15)
BUN: 19 mg/dL (ref 8–23)
CO2: 30 mmol/L (ref 22–32)
Calcium: 9.2 mg/dL (ref 8.9–10.3)
Chloride: 104 mmol/L (ref 98–111)
Creatinine: 1.35 mg/dL — ABNORMAL HIGH (ref 0.44–1.00)
GFR, Estimated: 39 mL/min — ABNORMAL LOW (ref >60)
Glucose, Bld: 111 mg/dL — ABNORMAL HIGH (ref 70–99)
Potassium: 4.4 mmol/L (ref 3.5–5.1)
Sodium: 138 mmol/L (ref 135–145)
Total Bilirubin: 0.4 mg/dL (ref 0.3–1.2)
Total Protein: 6.6 g/dL (ref 6.5–8.1)

## 2022-05-27 NOTE — Progress Notes (Signed)
[place] Telephone:(336) [phone]   Fax:(336) [phone]  PROGRESS NOTE  Patient Care Team: Kathyrn Lass, MD as PCP - General (Family Medicine)  Hematological/Oncological History # Thrombocytopenia, Unclear Etiology 05/25/2014: WBC 8.2, Hgb 14.4, MCV 81.3, Plt 162 06/17/2015: WBC 5.5, Hgb 13.9, MCV 81, Plt 124 10/25/2018: WBC 6.1, Hgb 13.4, MCV 81.8, Plt 147 05/01/2020: WBC 6.5, Hgb 13.8, MCV 78.2, Plt 79 05/22/2020: establish care with Dr. Lorenso Courier  12/25/2020: Bone marrow biopsy performed which showed a minor monoclonal B-cell population but no other abnormalities noted. 05/28/2021: WBC 5.4, Hgb 12.3, MCV 81, Plt 138 05/27/2022: Platelets 128, Hgb 12.0 and WBC 4.4  Interval History:  Brittney [name] 87 y.o. female with medical history significant for thrombocytopenia of unclear etiology presents for a follow up visit. The patient's last visit was on 05/28/2021. In the interim since the last visit she has had no major changes in her health.  On exam today Brittney [name] reports she has been well overall in the interim since her last visit.  She has had no major changes in her health and had no hospitalizations or emergency room visits.  She is had no changes in her medication list.  She reports that she is not having any issues with bleeding or bruising.  Her weight has increased modestly, up 3 pounds in the last year.  She reports her energy is excellent at 10 out of 10.  Her appetite is strong.  She notes that her appetite has been good and she has not had any issues with fatigue, weight loss, or shortness of breath.  She currently denies any fevers, chills, sweats, nausea, running or diarrhea.  A full 10 point ROS is listed below.  We discussed plan for follow-up today and given her modest thrombocytopenia no intervention on our part she would like to be followed by her primary care provider moving forward.  I do believe this would be reasonable with a proper referral if she were  to have platelets that consistently drop below 100.  MEDICAL HISTORY:  Past Medical History:  Diagnosis Date   CKD (chronic kidney disease), stage III (HCC)    Familial hypercholesterolemia    GERD (gastroesophageal reflux disease)    Glaucoma    Hypothyroid    Migraine headache    Post herpetic neuralgia     SURGICAL HISTORY: Past Surgical History:  Procedure Laterality Date   ANAL FISSURE REPAIR     APPENDECTOMY     BASAL CELL CARCINOMA EXCISION     cataract surgery     left   CHOLECYSTECTOMY     hemithyroidectomy     INCONTINENCE SURGERY     THYROIDECTOMY     TUBAL LIGATION     VAGINAL HYSTERECTOMY     vaginal vault repair      SOCIAL HISTORY: Social History   Socioeconomic History   Marital status: Married    Spouse name: Nadara Mustard   Number of children: 4   Years of education: 12   Highest education level: Not on file  Occupational History   Not on file  Tobacco Use   Smoking status: Never   Smokeless tobacco: Never  Vaping Use   Vaping Use: Never used  Substance and Sexual Activity   Alcohol use: Never   Drug use: Never   Sexual activity: Yes  Other Topics Concern   Not on file  Social History Narrative   Not on file   Social Determinants of Radio broadcast assistant  Strain: Not on file  Food Insecurity: Not on file  Transportation Needs: Not on file  Physical Activity: Not on file  Stress: Not on file  Social Connections: Not on file  Intimate Partner Violence: Not on file    FAMILY HISTORY: Family History  Problem Relation Age of Onset   Breast cancer Mother 24   Heart disease Mother        chf   Heart disease Sister        CABG    ALLERGIES:  is allergic to amitriptyline, latex, meperidine, estradiol, sulfa antibiotics, vioxx [rofecoxib], augmentin [amoxicillin-pot clavulanate], lipitor [atorvastatin], motrin [ibuprofen], and rosuvastatin.  MEDICATIONS:  Current Outpatient Medications  Medication Sig Dispense Refill    acetaminophen (TYLENOL) 500 MG tablet 1 tablet as needed     butalbital-acetaminophen-caffeine (FIORICET, ESGIC) 50-325-40 MG tablet take 1 tablet by mouth if needed for headache, no more than 1 a day  0   carboxymethylcellulose (REFRESH PLUS) 0.5 % SOLN Apply to eye.     Cholecalciferol 25 MCG (1000 UT) capsule Take by mouth.     donepezil (ARICEPT) 10 MG tablet Take 1 tablet (10 mg total) by mouth at bedtime. 90 tablet 3   ezetimibe (ZETIA) 10 MG tablet Take 10 mg by mouth daily.     gabapentin (NEURONTIN) 300 MG capsule Take 300 mg by mouth 3 (three) times daily.     levocetirizine (XYZAL) 5 MG tablet TAKE 1 TABLET BY MOUTH EVERY EVENING 30 tablet 5   levothyroxine (SYNTHROID, LEVOTHROID) 75 MCG tablet Take 75 mcg by mouth daily before breakfast.     LUMIGAN 0.01 % SOLN INSTILL 1 GTT INTO OU NIGHTLY  6   MYRBETRIQ 50 MG TB24 tablet Take 50 mg by mouth daily.     timolol (TIMOPTIC) 0.25 % ophthalmic solution instill 1 drop into both eyes once daily  0   tizanidine (ZANAFLEX) 2 MG capsule Take 2 mg by mouth 3 (three) times daily as needed for muscle spasms.     No current facility-administered medications for this visit.    REVIEW OF SYSTEMS:   Constitutional: ( - ) fevers, ( - )  chills , ( - ) night sweats Eyes: ( - ) blurriness of vision, ( - ) double vision, ( - ) watery eyes Ears, nose, mouth, throat, and face: ( - ) mucositis, ( - ) sore throat Respiratory: ( - ) cough, ( - ) dyspnea, ( - ) wheezes Cardiovascular: ( - ) palpitation, ( - ) chest discomfort, ( - ) lower extremity swelling Gastrointestinal:  ( - ) nausea, ( - ) heartburn, ( - ) change in bowel habits Skin: ( - ) abnormal skin rashes Lymphatics: ( - ) new lymphadenopathy, ( - ) easy bruising Neurological: ( - ) numbness, ( - ) tingling, ( - ) new weaknesses Behavioral/Psych: ( - ) mood change, ( - ) new changes  All other systems were reviewed with the patient and are negative.  PHYSICAL EXAMINATION:  Vitals:    05/27/22 1010  BP: (!) 173/70  Pulse: (!) 58  Resp: 16  Temp: 98.5 F (36.9 C)  SpO2: 96%    Filed Weights   05/27/22 1010  Weight: 159 lb 8 oz (72.3 kg)     GENERAL: alert, no distress and comfortable SKIN: skin color, texture, turgor are normal, no rashes or significant lesions EYES: conjunctiva are pink and non-injected, sclera clear LUNGS: clear to auscultation and percussion with normal breathing effort HEART: regular rate &  rhythm and no murmurs and no lower extremity edema Musculoskeletal: no cyanosis of digits and no clubbing  PSYCH: alert & oriented x 3, fluent speech NEURO: no focal motor/sensory deficits  LABORATORY DATA:  I have reviewed the data as listed    Latest Ref Rng & Units 05/27/2022    9:24 AM 05/28/2021   10:52 AM 04/06/2021   11:22 AM  CBC  WBC 4.0 - 10.5 K/uL 4.4  5.4  12.5   Hemoglobin 12.0 - 15.0 g/dL 12.0  12.3  12.7   Hematocrit 36.0 - 46.0 % 37.3  38.8  40.4   Platelets 150 - 400 K/uL 128  138  179        Latest Ref Rng & Units 05/27/2022    9:24 AM 05/28/2021   10:52 AM 04/06/2021   11:22 AM  CMP  Glucose 70 - 99 mg/dL 111  109  118   BUN 8 - 23 mg/dL _0 Creatinine 0.44 - 1.00 mg/dL 1.35  1.31  1.19   Sodium 135 - 145 mmol/L 138  139  137   Potassium 3.5 - 5.1 mmol/L 4.4  4.7  4.1   Chloride 98 - 111 mmol/L 104  103  100   CO2 22 - 32 mmol/L _1 Calcium 8.9 - 10.3 mg/dL 9.2  9.3  9.4   Total Protein 6.5 - 8.1 g/dL 6.6  6.7  7.3   Total Bilirubin 0.3 - 1.2 mg/dL 0.4  0.5  0.4   Alkaline Phos 38 - 126 U/L 83  90  104   AST 15 - 41 U/L _2 ALT 0 - 44 U/L _3 No results found for: "MPROTEIN" Lab Results  Component Value Date   KPAFRELGTCHN 19.4 05/22/2020   LAMBDASER 8.7 05/22/2020   KAPLAMBRATIO 4.76 12/01/2020   KAPLAMBRATIO 2.23 (H) 05/22/2020    RADIOGRAPHIC STUDIES: No results found.  ASSESSMENT & PLAN Brittney [name] 87 y.o. female with medical history significant for  thrombocytopenia of unclear etiology presents for a follow up visit.   After review the labs, review the records, discussion with the patient the findings are most consistent with cytopenia of unclear etiology.  She has had negative hepatitis serologies and a normal nutritional evaluation.  Additionally prior imaging it showed no evidence of liver disease or splenomegaly (07/04/2019).  As such the next step was to perform a bone marrow biopsy, which showed no evidence of bone marrow disorder.  Given these findings the findings are most consistent with a mild ITP.   In the interim the patient were to have a dramatic drop in her platelet count less than 30 we could try a steroid pulse for a presumed diagnosis of ITP.  # Thrombocytopenia, Unclear Etiology --negative hepatitis serologies and normal nutritional evaluation with vitamin B12 and folate --Platelets 128 today, Hgb 12.0 and WBC 4.4 --Platelet count by citrate as well as a review of peripheral blood film show no evidence of clumping as cause of thrombocytopenia --SPEP showed no M protein, however serum free light chains have an abnormal ratio. Bone marrow biopsy showed no abnormalities to explain the odd light chain ratio.  --Because no clear etiology could be discerned we proceeded with a bone marrow biopsy.  We not find any evidence of MDS, bone marrow disorder, multiple myeloma. --likely diagnosis of ITP, which would be a diagnosis of  exclusion. If Plts get <30 consider steroid pulse.  --RTC PRN.  Recommend she continue to follow with her primary care provider.  In the event the patient were to develop a severe sustained thrombocytopenia we are happy to see her back.  No orders of the defined types were placed in this encounter.  All questions were answered. The patient knows to call the clinic with any problems, questions or concerns.  A total of more than 25 minutes were spent on this encounter and over half of that time was spent on  counseling and coordination of care as outlined above.   Ledell Peoples, MD Department of Hematology/Oncology [place] at [facility] Phone: [phone] Pager: [phone] Email: [email or web address]  05/27/2022 2:04 PM

## 2022-06-15 ENCOUNTER — Other Ambulatory Visit: Payer: Self-pay | Admitting: Family Medicine

## 2022-06-15 DIAGNOSIS — Z1231 Encounter for screening mammogram for malignant neoplasm of breast: Secondary | ICD-10-CM

## 2022-07-20 DIAGNOSIS — Z6830 Body mass index (BMI) 30.0-30.9, adult: Secondary | ICD-10-CM | POA: Diagnosis not present

## 2022-07-20 DIAGNOSIS — R350 Frequency of micturition: Secondary | ICD-10-CM | POA: Diagnosis not present

## 2022-08-06 ENCOUNTER — Ambulatory Visit: Payer: PPO

## 2022-09-13 DIAGNOSIS — M7552 Bursitis of left shoulder: Secondary | ICD-10-CM | POA: Diagnosis not present

## 2022-09-13 DIAGNOSIS — Z6830 Body mass index (BMI) 30.0-30.9, adult: Secondary | ICD-10-CM | POA: Diagnosis not present

## 2022-09-13 DIAGNOSIS — F5101 Primary insomnia: Secondary | ICD-10-CM | POA: Diagnosis not present

## 2022-09-13 DIAGNOSIS — I129 Hypertensive chronic kidney disease with stage 1 through stage 4 chronic kidney disease, or unspecified chronic kidney disease: Secondary | ICD-10-CM | POA: Diagnosis not present

## 2022-09-13 DIAGNOSIS — G629 Polyneuropathy, unspecified: Secondary | ICD-10-CM | POA: Diagnosis not present

## 2022-09-13 DIAGNOSIS — E039 Hypothyroidism, unspecified: Secondary | ICD-10-CM | POA: Diagnosis not present

## 2022-09-13 DIAGNOSIS — I700 Atherosclerosis of aorta: Secondary | ICD-10-CM | POA: Diagnosis not present

## 2022-09-13 DIAGNOSIS — E7800 Pure hypercholesterolemia, unspecified: Secondary | ICD-10-CM | POA: Diagnosis not present

## 2022-09-13 DIAGNOSIS — G720 Drug-induced myopathy: Secondary | ICD-10-CM | POA: Diagnosis not present

## 2022-09-13 DIAGNOSIS — G3184 Mild cognitive impairment, so stated: Secondary | ICD-10-CM | POA: Diagnosis not present

## 2022-09-13 DIAGNOSIS — D696 Thrombocytopenia, unspecified: Secondary | ICD-10-CM | POA: Diagnosis not present

## 2022-09-13 DIAGNOSIS — R7303 Prediabetes: Secondary | ICD-10-CM | POA: Diagnosis not present

## 2022-09-21 DIAGNOSIS — M25512 Pain in left shoulder: Secondary | ICD-10-CM | POA: Diagnosis not present

## 2022-09-27 DIAGNOSIS — M25512 Pain in left shoulder: Secondary | ICD-10-CM | POA: Diagnosis not present

## 2022-10-15 DIAGNOSIS — N1832 Chronic kidney disease, stage 3b: Secondary | ICD-10-CM | POA: Diagnosis not present

## 2022-10-15 DIAGNOSIS — R059 Cough, unspecified: Secondary | ICD-10-CM | POA: Diagnosis not present

## 2022-10-15 DIAGNOSIS — M79602 Pain in left arm: Secondary | ICD-10-CM | POA: Diagnosis not present

## 2022-11-02 DIAGNOSIS — H401131 Primary open-angle glaucoma, bilateral, mild stage: Secondary | ICD-10-CM | POA: Diagnosis not present

## 2022-11-02 DIAGNOSIS — H25811 Combined forms of age-related cataract, right eye: Secondary | ICD-10-CM | POA: Diagnosis not present

## 2022-11-18 DIAGNOSIS — Z6831 Body mass index (BMI) 31.0-31.9, adult: Secondary | ICD-10-CM | POA: Diagnosis not present

## 2022-11-18 DIAGNOSIS — R059 Cough, unspecified: Secondary | ICD-10-CM | POA: Diagnosis not present

## 2022-12-16 ENCOUNTER — Ambulatory Visit
Admission: RE | Admit: 2022-12-16 | Discharge: 2022-12-16 | Disposition: A | Discharge status: Home / Self Care | Payer: PPO | Source: Ambulatory Visit | Attending: Family Medicine | Admitting: Family Medicine

## 2022-12-16 DIAGNOSIS — Z1231 Encounter for screening mammogram for malignant neoplasm of breast: Secondary | ICD-10-CM

## 2023-01-03 ENCOUNTER — Other Ambulatory Visit: Payer: Self-pay | Admitting: Family Medicine

## 2023-01-03 DIAGNOSIS — N644 Mastodynia: Secondary | ICD-10-CM

## 2023-02-23 ENCOUNTER — Ambulatory Visit
Admission: RE | Admit: 2023-02-23 | Discharge: 2023-02-23 | Disposition: A | Discharge status: Home / Self Care | Payer: PPO | Source: Ambulatory Visit | Attending: Family Medicine | Admitting: Family Medicine

## 2023-02-23 DIAGNOSIS — N6315 Unspecified lump in the right breast, overlapping quadrants: Secondary | ICD-10-CM | POA: Diagnosis not present

## 2023-02-23 DIAGNOSIS — N644 Mastodynia: Secondary | ICD-10-CM | POA: Diagnosis not present

## 2023-05-10 DIAGNOSIS — N3281 Overactive bladder: Secondary | ICD-10-CM | POA: Diagnosis not present

## 2023-05-10 DIAGNOSIS — E7800 Pure hypercholesterolemia, unspecified: Secondary | ICD-10-CM | POA: Diagnosis not present

## 2023-05-10 DIAGNOSIS — R7303 Prediabetes: Secondary | ICD-10-CM | POA: Diagnosis not present

## 2023-05-10 DIAGNOSIS — M8589 Other specified disorders of bone density and structure, multiple sites: Secondary | ICD-10-CM | POA: Diagnosis not present

## 2023-05-10 DIAGNOSIS — M25551 Pain in right hip: Secondary | ICD-10-CM | POA: Diagnosis not present

## 2023-05-10 DIAGNOSIS — M159 Polyosteoarthritis, unspecified: Secondary | ICD-10-CM | POA: Diagnosis not present

## 2023-05-10 DIAGNOSIS — E559 Vitamin D deficiency, unspecified: Secondary | ICD-10-CM | POA: Diagnosis not present

## 2023-05-10 DIAGNOSIS — N1832 Chronic kidney disease, stage 3b: Secondary | ICD-10-CM | POA: Diagnosis not present

## 2023-05-10 DIAGNOSIS — K219 Gastro-esophageal reflux disease without esophagitis: Secondary | ICD-10-CM | POA: Diagnosis not present

## 2023-05-10 DIAGNOSIS — E039 Hypothyroidism, unspecified: Secondary | ICD-10-CM | POA: Diagnosis not present

## 2023-05-10 DIAGNOSIS — K589 Irritable bowel syndrome without diarrhea: Secondary | ICD-10-CM | POA: Diagnosis not present

## 2023-05-10 DIAGNOSIS — Z23 Encounter for immunization: Secondary | ICD-10-CM | POA: Diagnosis not present

## 2023-05-10 DIAGNOSIS — Z0000 Encounter for general adult medical examination without abnormal findings: Secondary | ICD-10-CM | POA: Diagnosis not present

## 2023-05-10 DIAGNOSIS — R220 Localized swelling, mass and lump, head: Secondary | ICD-10-CM | POA: Diagnosis not present

## 2023-05-10 DIAGNOSIS — G629 Polyneuropathy, unspecified: Secondary | ICD-10-CM | POA: Diagnosis not present

## 2023-05-10 DIAGNOSIS — G43909 Migraine, unspecified, not intractable, without status migrainosus: Secondary | ICD-10-CM | POA: Diagnosis not present

## 2023-05-10 DIAGNOSIS — I700 Atherosclerosis of aorta: Secondary | ICD-10-CM | POA: Diagnosis not present

## 2023-05-17 DIAGNOSIS — M160 Bilateral primary osteoarthritis of hip: Secondary | ICD-10-CM | POA: Diagnosis not present

## 2023-05-18 ENCOUNTER — Other Ambulatory Visit: Payer: Self-pay | Admitting: Family Medicine

## 2023-05-18 DIAGNOSIS — M8589 Other specified disorders of bone density and structure, multiple sites: Secondary | ICD-10-CM

## 2023-06-20 DIAGNOSIS — M25552 Pain in left hip: Secondary | ICD-10-CM | POA: Diagnosis not present

## 2023-06-20 DIAGNOSIS — M7062 Trochanteric bursitis, left hip: Secondary | ICD-10-CM | POA: Diagnosis not present

## 2023-07-26 DIAGNOSIS — H401131 Primary open-angle glaucoma, bilateral, mild stage: Secondary | ICD-10-CM | POA: Diagnosis not present

## 2023-07-26 DIAGNOSIS — H25811 Combined forms of age-related cataract, right eye: Secondary | ICD-10-CM | POA: Diagnosis not present

## 2023-11-10 DIAGNOSIS — I10 Essential (primary) hypertension: Secondary | ICD-10-CM | POA: Diagnosis not present

## 2023-11-10 DIAGNOSIS — N1832 Chronic kidney disease, stage 3b: Secondary | ICD-10-CM | POA: Diagnosis not present

## 2023-11-10 DIAGNOSIS — E7800 Pure hypercholesterolemia, unspecified: Secondary | ICD-10-CM | POA: Diagnosis not present

## 2023-11-10 DIAGNOSIS — M51369 Other intervertebral disc degeneration, lumbar region without mention of lumbar back pain or lower extremity pain: Secondary | ICD-10-CM | POA: Diagnosis not present

## 2023-11-10 DIAGNOSIS — M797 Fibromyalgia: Secondary | ICD-10-CM | POA: Diagnosis not present

## 2024-01-24 DIAGNOSIS — Z6830 Body mass index (BMI) 30.0-30.9, adult: Secondary | ICD-10-CM | POA: Diagnosis not present

## 2024-01-24 DIAGNOSIS — M5416 Radiculopathy, lumbar region: Secondary | ICD-10-CM | POA: Diagnosis not present

## 2024-01-25 ENCOUNTER — Other Ambulatory Visit: Payer: Self-pay | Admitting: Neurosurgery

## 2024-01-25 DIAGNOSIS — M5416 Radiculopathy, lumbar region: Secondary | ICD-10-CM

## 2024-02-04 ENCOUNTER — Ambulatory Visit
Admission: RE | Admit: 2024-02-04 | Discharge: 2024-02-04 | Disposition: A | Discharge status: Home / Self Care | Source: Ambulatory Visit | Attending: Neurosurgery | Admitting: Neurosurgery

## 2024-02-04 DIAGNOSIS — M5416 Radiculopathy, lumbar region: Secondary | ICD-10-CM

## 2024-02-04 DIAGNOSIS — M5146 Schmorl's nodes, lumbar region: Secondary | ICD-10-CM | POA: Diagnosis not present

## 2024-02-04 DIAGNOSIS — M5126 Other intervertebral disc displacement, lumbar region: Secondary | ICD-10-CM | POA: Diagnosis not present

## 2024-02-27 DIAGNOSIS — R102 Pelvic and perineal pain: Secondary | ICD-10-CM | POA: Diagnosis not present

## 2024-02-27 DIAGNOSIS — Z6830 Body mass index (BMI) 30.0-30.9, adult: Secondary | ICD-10-CM | POA: Diagnosis not present

## 2024-03-02 ENCOUNTER — Other Ambulatory Visit: Payer: Self-pay | Admitting: Neurosurgery

## 2024-03-02 ENCOUNTER — Encounter: Payer: Self-pay | Admitting: Neurosurgery

## 2024-03-02 DIAGNOSIS — R102 Pelvic and perineal pain: Secondary | ICD-10-CM

## 2024-03-06 ENCOUNTER — Ambulatory Visit
Admission: RE | Admit: 2024-03-06 | Discharge: 2024-03-06 | Disposition: A | Discharge status: Home / Self Care | Source: Ambulatory Visit | Attending: Neurosurgery | Admitting: Neurosurgery

## 2024-03-06 DIAGNOSIS — M549 Dorsalgia, unspecified: Secondary | ICD-10-CM | POA: Diagnosis not present

## 2024-03-06 DIAGNOSIS — R102 Pelvic and perineal pain: Secondary | ICD-10-CM | POA: Diagnosis not present

## 2024-03-15 DIAGNOSIS — R102 Pelvic and perineal pain: Secondary | ICD-10-CM | POA: Diagnosis not present

## 2024-03-29 DIAGNOSIS — G3184 Mild cognitive impairment, so stated: Secondary | ICD-10-CM | POA: Diagnosis not present

## 2024-03-29 DIAGNOSIS — E7800 Pure hypercholesterolemia, unspecified: Secondary | ICD-10-CM | POA: Diagnosis not present

## 2024-03-29 DIAGNOSIS — I10 Essential (primary) hypertension: Secondary | ICD-10-CM | POA: Diagnosis not present

## 2024-03-29 DIAGNOSIS — M51362 Other intervertebral disc degeneration, lumbar region with discogenic back pain and lower extremity pain: Secondary | ICD-10-CM | POA: Diagnosis not present

## 2024-05-01 DIAGNOSIS — H401131 Primary open-angle glaucoma, bilateral, mild stage: Secondary | ICD-10-CM | POA: Diagnosis not present

## 2024-05-01 DIAGNOSIS — H25811 Combined forms of age-related cataract, right eye: Secondary | ICD-10-CM | POA: Diagnosis not present

## 2024-05-17 DIAGNOSIS — Z23 Encounter for immunization: Secondary | ICD-10-CM | POA: Diagnosis not present

## 2024-05-17 DIAGNOSIS — M51362 Other intervertebral disc degeneration, lumbar region with discogenic back pain and lower extremity pain: Secondary | ICD-10-CM | POA: Diagnosis not present

## 2024-05-17 DIAGNOSIS — G43909 Migraine, unspecified, not intractable, without status migrainosus: Secondary | ICD-10-CM | POA: Diagnosis not present

## 2024-05-17 DIAGNOSIS — E039 Hypothyroidism, unspecified: Secondary | ICD-10-CM | POA: Diagnosis not present

## 2024-05-17 DIAGNOSIS — N1832 Chronic kidney disease, stage 3b: Secondary | ICD-10-CM | POA: Diagnosis not present

## 2024-05-17 DIAGNOSIS — I129 Hypertensive chronic kidney disease with stage 1 through stage 4 chronic kidney disease, or unspecified chronic kidney disease: Secondary | ICD-10-CM | POA: Diagnosis not present

## 2024-05-17 DIAGNOSIS — Z0000 Encounter for general adult medical examination without abnormal findings: Secondary | ICD-10-CM | POA: Diagnosis not present

## 2024-05-17 DIAGNOSIS — R7303 Prediabetes: Secondary | ICD-10-CM | POA: Diagnosis not present

## 2024-05-17 DIAGNOSIS — I10 Essential (primary) hypertension: Secondary | ICD-10-CM | POA: Diagnosis not present

## 2024-05-17 DIAGNOSIS — G3184 Mild cognitive impairment, so stated: Secondary | ICD-10-CM | POA: Diagnosis not present

## 2024-05-17 DIAGNOSIS — I700 Atherosclerosis of aorta: Secondary | ICD-10-CM | POA: Diagnosis not present

## 2024-05-17 DIAGNOSIS — D696 Thrombocytopenia, unspecified: Secondary | ICD-10-CM | POA: Diagnosis not present

## 2024-05-17 DIAGNOSIS — G629 Polyneuropathy, unspecified: Secondary | ICD-10-CM | POA: Diagnosis not present

## 2024-05-17 DIAGNOSIS — E559 Vitamin D deficiency, unspecified: Secondary | ICD-10-CM | POA: Diagnosis not present

## 2024-05-17 DIAGNOSIS — E7800 Pure hypercholesterolemia, unspecified: Secondary | ICD-10-CM | POA: Diagnosis not present

## 2024-07-06 ENCOUNTER — Emergency Department (HOSPITAL_BASED_OUTPATIENT_CLINIC_OR_DEPARTMENT_OTHER)
Admission: EM | Admit: 2024-07-06 | Discharge: 2024-07-06 | Disposition: A | Discharge status: Home / Self Care | Source: Ambulatory Visit | Attending: Emergency Medicine | Admitting: Emergency Medicine

## 2024-07-06 ENCOUNTER — Other Ambulatory Visit: Payer: Self-pay

## 2024-07-06 ENCOUNTER — Encounter (HOSPITAL_BASED_OUTPATIENT_CLINIC_OR_DEPARTMENT_OTHER): Payer: Self-pay

## 2024-07-06 DIAGNOSIS — E860 Dehydration: Secondary | ICD-10-CM | POA: Insufficient documentation

## 2024-07-06 DIAGNOSIS — Z79899 Other long term (current) drug therapy: Secondary | ICD-10-CM | POA: Diagnosis not present

## 2024-07-06 DIAGNOSIS — Z91040 Latex allergy status: Secondary | ICD-10-CM | POA: Insufficient documentation

## 2024-07-06 DIAGNOSIS — R42 Dizziness and giddiness: Secondary | ICD-10-CM | POA: Diagnosis not present

## 2024-07-06 LAB — CBC WITH DIFFERENTIAL/PLATELET
Abs Immature Granulocytes: 0.03 K/uL (ref 0.00–0.07)
Basophils Absolute: 0.1 K/uL (ref 0.0–0.1)
Basophils Relative: 2 %
Eosinophils Absolute: 0.2 K/uL (ref 0.0–0.5)
Eosinophils Relative: 3 %
HCT: 36.7 % (ref 36.0–46.0)
Hemoglobin: 11.8 g/dL — ABNORMAL LOW (ref 12.0–15.0)
Immature Granulocytes: 0 %
Lymphocytes Relative: 35 %
Lymphs Abs: 2.3 K/uL (ref 0.7–4.0)
MCH: 25.7 pg — ABNORMAL LOW (ref 26.0–34.0)
MCHC: 32.2 g/dL (ref 30.0–36.0)
MCV: 79.8 fL — ABNORMAL LOW (ref 80.0–100.0)
Monocytes Absolute: 0.5 K/uL (ref 0.1–1.0)
Monocytes Relative: 8 %
Neutro Abs: 3.6 K/uL (ref 1.7–7.7)
Neutrophils Relative %: 52 %
Platelets: 190 K/uL (ref 150–400)
RBC: 4.6 MIL/uL (ref 3.87–5.11)
RDW: 15.5 % (ref 11.5–15.5)
WBC: 6.8 K/uL (ref 4.0–10.5)
nRBC: 0 % (ref 0.0–0.2)

## 2024-07-06 LAB — BASIC METABOLIC PANEL WITH GFR
Anion gap: 11 (ref 5–15)
BUN: 21 mg/dL (ref 8–23)
CO2: 26 mmol/L (ref 22–32)
Calcium: 9.6 mg/dL (ref 8.9–10.3)
Chloride: 103 mmol/L (ref 98–111)
Creatinine, Ser: 1.66 mg/dL — ABNORMAL HIGH (ref 0.44–1.00)
GFR, Estimated: 30 mL/min — ABNORMAL LOW (ref >60)
Glucose, Bld: 117 mg/dL — ABNORMAL HIGH (ref 70–99)
Potassium: 4.8 mmol/L (ref 3.5–5.1)
Sodium: 140 mmol/L (ref 135–145)

## 2024-07-06 LAB — CBG MONITORING, ED: Glucose-Capillary: 122 mg/dL — ABNORMAL HIGH (ref 70–99)

## 2024-07-06 MED ORDER — METOCLOPRAMIDE HCL 5 MG/ML IJ SOLN
10.0000 mg | Freq: Once | INTRAMUSCULAR | Status: AC
  Administered 2024-07-06: 10 mg via INTRAVENOUS
  Filled 2024-07-06: qty 2

## 2024-07-06 MED ORDER — SODIUM CHLORIDE 0.9 % IV BOLUS
1000.0000 mL | Freq: Once | INTRAVENOUS | Status: AC
  Administered 2024-07-06: 1000 mL via INTRAVENOUS

## 2024-07-06 NOTE — Discharge Instructions (Signed)
 You were seen in the emerged from for dizziness Your blood work showed some mild dehydration You felt better after fluids and a medication called Reglan  which can help with dizziness and headaches Follow-up with your primary doctor continue taking all previous prescribed indications Return to the emergency room for severe dizziness headaches or any other concerns

## 2024-07-06 NOTE — ED Notes (Signed)
 Reviewed AVS/discharge instruction with patient. Time allotted for and all questions answered. Patient is agreeable for d/c and escorted to ed exit by staff.

## 2024-07-06 NOTE — ED Triage Notes (Signed)
 Pt reports dizziness x2 weeks. Pt denies any N/V/D.

## 2024-07-06 NOTE — ED Provider Notes (Signed)
 [place] EMERGENCY DEPARTMENT AT [facility] Provider Note   CSN: [id number] Arrival date & time: 07/06/24  8167     Patient presents with: Dizziness   Brittney [name] is a 89 y.o. female.  With a history of migraine headaches, vertigo, labyrinth vestibulitis who presents to the ED for dizziness.  Patient reports 2 weeks of ongoing dizziness.  She states this seems to happen every early fall every year.  Unidentified triggers.  Does note a bitemporal headache gradual onset.  No focal weakness.  Seen at urgent care and directed here for further evaluation.  No fever chills recent illness    Dizziness      Prior to Admission medications   Medication Sig Start Date End Date Taking? Authorizing Provider  acetaminophen  (TYLENOL ) 500 MG tablet 1 tablet as needed    [provider]  butalbital -acetaminophen -caffeine  (FIORICET, ESGIC) 50-325-40 MG tablet take 1 tablet by mouth if needed for headache, no more than 1 a day 01/09/16   [provider]  carboxymethylcellulose (REFRESH PLUS) 0.5 % SOLN Apply to eye.    [provider]  Cholecalciferol  25 MCG (1000 UT) capsule Take by mouth.    [provider]  donepezil  (ARICEPT ) 10 MG tablet Take 1 tablet (10 mg total) by mouth at bedtime. 11/03/17   Dohmeier, Dedra, MD  ezetimibe  (ZETIA ) 10 MG tablet Take 10 mg by mouth daily.    [provider]  gabapentin  (NEURONTIN ) 300 MG capsule Take 300 mg by mouth 3 (three) times daily. 05/05/20   [provider]  levocetirizine (XYZAL ) 5 MG tablet TAKE 1 TABLET BY MOUTH EVERY EVENING 06/21/19   Olalere, Adewale A, MD  levothyroxine  (SYNTHROID , LEVOTHROID) 75 MCG tablet Take 75 mcg by mouth daily before breakfast.    [provider]  LUMIGAN 0.01 % SOLN INSTILL 1 GTT INTO OU NIGHTLY 10/25/17   [provider]  MYRBETRIQ 50 MG TB24 tablet Take 50 mg by mouth daily. 05/03/20   [provider]  timolol  (TIMOPTIC ) 0.25 %  ophthalmic solution instill 1 drop into both eyes once daily 12/29/15   [provider]  tizanidine (ZANAFLEX) 2 MG capsule Take 2 mg by mouth 3 (three) times daily as needed for muscle spasms.    [provider]    Allergies: Amitriptyline, Latex, Meperidine, Estradiol, Sulfa antibiotics, Vioxx [rofecoxib], Augmentin [amoxicillin-pot clavulanate], Lipitor [atorvastatin], Motrin [ibuprofen], and Rosuvastatin    Review of Systems  Neurological:  Positive for dizziness.    Updated Vital Signs BP (!) 143/69   Pulse 81   Temp 97.8 F (36.6 C) (Oral)   Resp 13   Ht 5' 1 (1.549 m)   Wt 73 kg   SpO2 97%   BMI 30.42 kg/m   Physical Exam Vitals and nursing note reviewed.  HENT:     Head: Normocephalic and atraumatic.  Eyes:     Pupils: Pupils are equal, round, and reactive to light.  Cardiovascular:     Rate and Rhythm: Normal rate and regular rhythm.  Pulmonary:     Effort: Pulmonary effort is normal.     Breath sounds: Normal breath sounds.  Abdominal:     Palpations: Abdomen is soft.     Tenderness: There is no abdominal tenderness.  Skin:    General: Skin is warm and dry.  Neurological:     General: No focal deficit present.     Mental Status: She is alert.     Sensory: No sensory deficit.  Motor: No weakness.     Coordination: Coordination normal.  Psychiatric:        Mood and Affect: Mood normal.     (all labs ordered are listed, but only abnormal results are displayed) Labs Reviewed  BASIC METABOLIC PANEL WITH GFR - Abnormal; Notable for the following components:      Result Value   Glucose, Bld 117 (*)    Creatinine, Ser 1.66 (*)    GFR, Estimated 30 (*)    All other components within normal limits  CBC WITH DIFFERENTIAL/PLATELET - Abnormal; Notable for the following components:   Hemoglobin 11.8 (*)    MCV 79.8 (*)    MCH 25.7 (*)    All other components within normal limits  CBG MONITORING, ED - Abnormal; Notable for the following  components:   Glucose-Capillary 122 (*)    All other components within normal limits    EKG: EKG Interpretation Date/Time:  Friday July 06 2024 18:51:10 EST Ventricular Rate:  73 PR Interval:  181 QRS Duration:  82 QT Interval:  391 QTC Calculation: 431 R Axis:   15  Text Interpretation: Sinus rhythm Low voltage, precordial leads Abnormal R-wave progression, early transition Borderline ST elevation, inferior leads Confirmed by Pamella Sharper [phone]) on 07/06/2024 9:40:07 PM  Radiology: No results found.   Procedures   Medications Ordered in the ED  sodium chloride  0.9 % bolus 1,000 mL (0 mLs Intravenous Stopped 07/06/24 2059)  metoCLOPramide  (REGLAN ) injection 10 mg (10 mg Intravenous Given 07/06/24 1945)    Clinical Course as of 07/06/24 2140  Fri Jul 06, 2024  2138 EKG without dysrhythmia.  Laboratory workup notable for slight bump in creatinine consistent with some mild dehydration.  The rest of her electrolytes look okay.  No white count.  Patient feeling much better stable on her feet after fluids and Zofran .  Suspect mild dehydration as cause of her symptoms.  Orthostatic vital signs within normal limits.  Appropriate for discharge with PCP follow-up. [MP]    Clinical Course User Index [MP] Pamella Sharper LABOR, DO                                 Medical Decision Making 89 year old female with history as above returns for recurrent dizziness.  She has had prolonged dizziness episodes for many years.  Seem to happen in early fall.  No neurologic deficits on my exam.  Will obtain laboratory workup EKG to look for evidence of underlying metabolic disturbance dysrhythmia.  Will treat with migraine cocktail which has helped in the past and reassess.  No need for CT head or MRI at this point.  Low suspicion for hemorrhage or ischemic event.  Amount and/or Complexity of Data Reviewed Labs: ordered.  Risk Prescription drug management.        Final diagnoses:   Dizziness  Dehydration    ED Discharge Orders     None          Pamella Sharper LABOR, DO 07/06/24 2140
